# Patient Record
Sex: Female | Born: 1955 | Race: Black or African American | Hispanic: No | Smoking: Never smoker
Health system: Southern US, Community
[De-identification: ages and names within clinical notes are randomized; demographics above are authoritative.]

## PROBLEM LIST (undated history)

## (undated) DIAGNOSIS — M792 Neuralgia and neuritis, unspecified: Secondary | ICD-10-CM

## (undated) DIAGNOSIS — I10 Essential (primary) hypertension: Secondary | ICD-10-CM

## (undated) DIAGNOSIS — F32A Depression, unspecified: Secondary | ICD-10-CM

## (undated) DIAGNOSIS — E119 Type 2 diabetes mellitus without complications: Secondary | ICD-10-CM

## (undated) DIAGNOSIS — K219 Gastro-esophageal reflux disease without esophagitis: Secondary | ICD-10-CM

## (undated) DIAGNOSIS — F329 Major depressive disorder, single episode, unspecified: Secondary | ICD-10-CM

## (undated) DIAGNOSIS — K759 Inflammatory liver disease, unspecified: Secondary | ICD-10-CM

## (undated) DIAGNOSIS — M1990 Unspecified osteoarthritis, unspecified site: Secondary | ICD-10-CM

---

## 2012-07-27 DIAGNOSIS — K219 Gastro-esophageal reflux disease without esophagitis: Secondary | ICD-10-CM

## 2012-07-27 HISTORY — DX: Gastro-esophageal reflux disease without esophagitis: K21.9

## 2014-04-24 ENCOUNTER — Ambulatory Visit: Payer: Self-pay | Admitting: Bariatrics

## 2014-06-05 ENCOUNTER — Ambulatory Visit: Payer: Self-pay | Admitting: Gastroenterology

## 2014-07-27 DIAGNOSIS — M792 Neuralgia and neuritis, unspecified: Secondary | ICD-10-CM

## 2014-07-27 HISTORY — DX: Neuralgia and neuritis, unspecified: M79.2

## 2014-09-21 ENCOUNTER — Ambulatory Visit: Payer: Self-pay | Admitting: Family Medicine

## 2014-11-19 LAB — SURGICAL PATHOLOGY

## 2015-04-02 ENCOUNTER — Encounter
Admission: RE | Admit: 2015-04-02 | Discharge: 2015-04-02 | Disposition: A | Discharge status: Home / Self Care | Payer: 59 | Source: Ambulatory Visit | Attending: Bariatrics | Admitting: Bariatrics

## 2015-04-02 DIAGNOSIS — Z01812 Encounter for preprocedural laboratory examination: Secondary | ICD-10-CM | POA: Diagnosis not present

## 2015-04-02 HISTORY — DX: Gastro-esophageal reflux disease without esophagitis: K21.9

## 2015-04-02 HISTORY — DX: Unspecified osteoarthritis, unspecified site: M19.90

## 2015-04-02 HISTORY — DX: Essential (primary) hypertension: I10

## 2015-04-02 HISTORY — DX: Inflammatory liver disease, unspecified: K75.9

## 2015-04-02 HISTORY — DX: Type 2 diabetes mellitus without complications: E11.9

## 2015-04-02 HISTORY — DX: Neuralgia and neuritis, unspecified: M79.2

## 2015-04-02 LAB — TYPE AND SCREEN
ABO/RH(D): O POS
Antibody Screen: NEGATIVE

## 2015-04-02 LAB — CBC
HEMATOCRIT: 35 % (ref 35.0–47.0)
HEMOGLOBIN: 11.3 g/dL — AB (ref 12.0–16.0)
MCH: 23.9 pg — ABNORMAL LOW (ref 26.0–34.0)
MCHC: 32.4 g/dL (ref 32.0–36.0)
MCV: 73.9 fL — AB (ref 80.0–100.0)
Platelets: 393 10*3/uL (ref 150–440)
RBC: 4.74 MIL/uL (ref 3.80–5.20)
RDW: 15 % — AB (ref 11.5–14.5)
WBC: 7.9 10*3/uL (ref 3.6–11.0)

## 2015-04-02 LAB — BASIC METABOLIC PANEL
ANION GAP: 10 (ref 5–15)
BUN: 39 mg/dL — ABNORMAL HIGH (ref 6–20)
CHLORIDE: 99 mmol/L — AB (ref 101–111)
CO2: 23 mmol/L (ref 22–32)
Calcium: 9.6 mg/dL (ref 8.9–10.3)
Creatinine, Ser: 1.23 mg/dL — ABNORMAL HIGH (ref 0.44–1.00)
GFR calc Af Amer: 55 mL/min — ABNORMAL LOW (ref >60)
GFR calc non Af Amer: 47 mL/min — ABNORMAL LOW (ref >60)
GLUCOSE: 228 mg/dL — AB (ref 65–99)
POTASSIUM: 4.7 mmol/L (ref 3.5–5.1)
Sodium: 132 mmol/L — ABNORMAL LOW (ref 135–145)

## 2015-04-02 LAB — ABO/RH: ABO/RH(D): O POS

## 2015-04-02 NOTE — Pre-Procedure Instructions (Signed)
Pt had a sleep study done a few months ago and results were negative.

## 2015-04-02 NOTE — Patient Instructions (Signed)
  Your procedure is scheduled on: Tuesday April 09, 2015. Report to Same Day Surgery. To find out your arrival time please call [phone] between 1PM - 3PM on Monday April 08, 2015.  Remember: Instructions that are not followed completely may result in serious medical risk, up to and including death, or upon the discretion of your surgeon and anesthesiologist your surgery may need to be rescheduled.    __x__ 1. Do not eat food or drink liquids after midnight. No gum chewing or hard candies.     ____ 2. No Alcohol for 24 hours before or after surgery.   ____ 3. Bring all medications with you on the day of surgery if instructed.    __x__ 4. Notify your doctor if there is any change in your medical condition     (cold, fever, infections).     Do not wear jewelry, make-up, hairpins, clips or nail polish.  Do not wear lotions, powders, or perfumes. You may wear deodorant.  Do not shave 48 hours prior to surgery. Men may shave face and neck.  Do not bring valuables to the hospital.    [facility] is not responsible for any belongings or valuables.               Contacts, dentures or bridgework may not be worn into surgery.  Leave your suitcase in the car. After surgery it may be brought to your room.  For patients admitted to the hospital, discharge time is determined by your treatment team.   Patients discharged the day of surgery will not be allowed to drive home.    Please read over the following fact sheets that you were given:   [facility] Preparing for Surgery  _x___ Take these medicines the morning of surgery with A SIP OF WATER:    1. metoprolol tartrate (LOPRESSOR)    ____ Fleet Enema (as directed)   __x__ Use CHG Soap as directed  ____ Use inhalers on the day of surgery  _x___ Stop metformin 2 days prior to surgery (Sunday)    _x___ Take 1/2 of usual insulin dose the night before surgery and none on the morning of surgery.   ____ Stop  Coumadin/Plavix/aspirin on does not apply.  __x__ Already stopped  Anti-inflammatories.  Tylenol can be taken for pain.   ____ Stop supplements until after surgery.    ____ Bring C-Pap to the hospital.

## 2015-04-09 ENCOUNTER — Inpatient Hospital Stay: Payer: 59 | Admitting: Anesthesiology

## 2015-04-09 ENCOUNTER — Encounter
Admission: RE | Disposition: A | Discharge status: Home / Self Care | Payer: Self-pay | Source: Ambulatory Visit | Attending: Bariatrics

## 2015-04-09 ENCOUNTER — Inpatient Hospital Stay
Admission: RE | Admit: 2015-04-09 | Discharge: 2015-04-11 | DRG: 620 | Disposition: A | Discharge status: Home / Self Care | Payer: 59 | Source: Ambulatory Visit | Attending: Bariatrics | Admitting: Bariatrics

## 2015-04-09 ENCOUNTER — Encounter: Payer: Self-pay | Admitting: *Deleted

## 2015-04-09 DIAGNOSIS — K759 Inflammatory liver disease, unspecified: Secondary | ICD-10-CM | POA: Diagnosis present

## 2015-04-09 DIAGNOSIS — K219 Gastro-esophageal reflux disease without esophagitis: Secondary | ICD-10-CM | POA: Diagnosis present

## 2015-04-09 DIAGNOSIS — M1990 Unspecified osteoarthritis, unspecified site: Secondary | ICD-10-CM | POA: Diagnosis present

## 2015-04-09 DIAGNOSIS — Z6841 Body Mass Index (BMI) 40.0 and over, adult: Secondary | ICD-10-CM | POA: Diagnosis not present

## 2015-04-09 DIAGNOSIS — B9681 Helicobacter pylori [H. pylori] as the cause of diseases classified elsewhere: Secondary | ICD-10-CM | POA: Diagnosis present

## 2015-04-09 DIAGNOSIS — I10 Essential (primary) hypertension: Secondary | ICD-10-CM | POA: Diagnosis present

## 2015-04-09 DIAGNOSIS — M792 Neuralgia and neuritis, unspecified: Secondary | ICD-10-CM | POA: Diagnosis present

## 2015-04-09 DIAGNOSIS — K449 Diaphragmatic hernia without obstruction or gangrene: Secondary | ICD-10-CM | POA: Diagnosis present

## 2015-04-09 DIAGNOSIS — E1142 Type 2 diabetes mellitus with diabetic polyneuropathy: Secondary | ICD-10-CM | POA: Diagnosis present

## 2015-04-09 DIAGNOSIS — K660 Peritoneal adhesions (postprocedural) (postinfection): Secondary | ICD-10-CM | POA: Diagnosis present

## 2015-04-09 DIAGNOSIS — Z833 Family history of diabetes mellitus: Secondary | ICD-10-CM | POA: Diagnosis not present

## 2015-04-09 DIAGNOSIS — K420 Umbilical hernia with obstruction, without gangrene: Secondary | ICD-10-CM | POA: Diagnosis present

## 2015-04-09 DIAGNOSIS — Z794 Long term (current) use of insulin: Secondary | ICD-10-CM | POA: Diagnosis not present

## 2015-04-09 DIAGNOSIS — Z9884 Bariatric surgery status: Secondary | ICD-10-CM

## 2015-04-09 HISTORY — PX: GASTRIC ROUX-EN-Y: SHX5262

## 2015-04-09 HISTORY — PX: UMBILICAL HERNIA REPAIR: SHX196

## 2015-04-09 HISTORY — PX: HIATAL HERNIA REPAIR: SHX195

## 2015-04-09 HISTORY — PX: LYSIS OF ADHESION: SHX5961

## 2015-04-09 LAB — GLUCOSE, CAPILLARY
GLUCOSE-CAPILLARY: 106 mg/dL — AB (ref 65–99)
Glucose-Capillary: 246 mg/dL — ABNORMAL HIGH (ref 65–99)
Glucose-Capillary: 217 mg/dL — ABNORMAL HIGH (ref 65–99)

## 2015-04-09 SURGERY — LAPAROSCOPIC ROUX-EN-Y GASTRIC
Anesthesia: General | Wound class: Clean Contaminated

## 2015-04-09 MED ORDER — CLONIDINE HCL 0.3 MG/24HR TD PTWK
0.3000 mg | MEDICATED_PATCH | TRANSDERMAL | Status: DC
  Filled 2015-04-09: qty 1

## 2015-04-09 MED ORDER — MIDAZOLAM HCL 2 MG/2ML IJ SOLN
INTRAMUSCULAR | Status: DC | PRN
  Administered 2015-04-09: 2 mg via INTRAVENOUS

## 2015-04-09 MED ORDER — HYDROMORPHONE HCL 1 MG/ML IJ SOLN
1.0000 mg | INTRAMUSCULAR | Status: DC | PRN
  Administered 2015-04-09 (×3): 1 mg via INTRAVENOUS
  Administered 2015-04-10 (×2): 2 mg via INTRAVENOUS
  Filled 2015-04-09: qty 2
  Filled 2015-04-09: qty 1
  Filled 2015-04-09: qty 2
  Filled 2015-04-09: qty 1
  Filled 2015-04-09: qty 2

## 2015-04-09 MED ORDER — FENTANYL CITRATE (PF) 100 MCG/2ML IJ SOLN
INTRAMUSCULAR | Status: DC | PRN
  Administered 2015-04-09: 50 ug via INTRAVENOUS
  Administered 2015-04-09: 100 ug via INTRAVENOUS
  Administered 2015-04-09 (×2): 50 ug via INTRAVENOUS

## 2015-04-09 MED ORDER — BUPIVACAINE-EPINEPHRINE (PF) 0.25% -1:200000 IJ SOLN
INTRAMUSCULAR | Status: AC
  Filled 2015-04-09: qty 60

## 2015-04-09 MED ORDER — INSULIN ASPART 100 UNIT/ML ~~LOC~~ SOLN
0.0000 [IU] | Freq: Three times a day (TID) | SUBCUTANEOUS | Status: DC
  Administered 2015-04-10: 3 [IU] via SUBCUTANEOUS
  Administered 2015-04-10: 2 [IU] via SUBCUTANEOUS
  Administered 2015-04-10: 3 [IU] via SUBCUTANEOUS
  Administered 2015-04-11: 2 [IU] via SUBCUTANEOUS
  Filled 2015-04-09: qty 3
  Filled 2015-04-09: qty 2
  Filled 2015-04-09: qty 3
  Filled 2015-04-09: qty 2

## 2015-04-09 MED ORDER — PROPOFOL 10 MG/ML IV BOLUS
INTRAVENOUS | Status: DC | PRN
  Administered 2015-04-09: 200 mg via INTRAVENOUS

## 2015-04-09 MED ORDER — SCOPOLAMINE 1 MG/3DAYS TD PT72
MEDICATED_PATCH | TRANSDERMAL | Status: AC
  Administered 2015-04-09: 1.5 mg via TRANSDERMAL
  Filled 2015-04-09: qty 1

## 2015-04-09 MED ORDER — GABAPENTIN 600 MG PO TABS
600.0000 mg | ORAL_TABLET | Freq: Every day | ORAL | Status: DC
  Administered 2015-04-10: 600 mg via ORAL
  Filled 2015-04-09: qty 1

## 2015-04-09 MED ORDER — SODIUM CHLORIDE 0.9 % IV SOLN
10000.0000 ug | INTRAVENOUS | Status: DC | PRN
  Administered 2015-04-09: 60 ug/min via INTRAVENOUS

## 2015-04-09 MED ORDER — BUPIVACAINE-EPINEPHRINE (PF) 0.25% -1:200000 IJ SOLN
INTRAMUSCULAR | Status: DC | PRN
  Administered 2015-04-09: 60 mL

## 2015-04-09 MED ORDER — HYDROMORPHONE HCL 1 MG/ML IJ SOLN
INTRAMUSCULAR | Status: DC | PRN
  Administered 2015-04-09 (×2): 1 mg via INTRAVENOUS

## 2015-04-09 MED ORDER — FENTANYL CITRATE (PF) 100 MCG/2ML IJ SOLN
INTRAMUSCULAR | Status: AC
  Administered 2015-04-09: 25 ug via INTRAVENOUS
  Filled 2015-04-09: qty 2

## 2015-04-09 MED ORDER — SCOPOLAMINE 1 MG/3DAYS TD PT72
1.0000 | MEDICATED_PATCH | TRANSDERMAL | Status: DC
  Administered 2015-04-09: 1.5 mg via TRANSDERMAL

## 2015-04-09 MED ORDER — POTASSIUM CHLORIDE IN NACL 20-0.45 MEQ/L-% IV SOLN
INTRAVENOUS | Status: DC
  Administered 2015-04-09 – 2015-04-10 (×4): via INTRAVENOUS
  Filled 2015-04-09 (×7): qty 1000

## 2015-04-09 MED ORDER — EPHEDRINE SULFATE 50 MG/ML IJ SOLN
INTRAMUSCULAR | Status: DC | PRN
  Administered 2015-04-09 (×2): 10 mg via INTRAVENOUS

## 2015-04-09 MED ORDER — ONDANSETRON HCL 4 MG/2ML IJ SOLN: 4.0000 mg | INTRAMUSCULAR | Status: DC | PRN

## 2015-04-09 MED ORDER — FENTANYL CITRATE (PF) 100 MCG/2ML IJ SOLN
25.0000 ug | INTRAMUSCULAR | Status: DC | PRN
  Administered 2015-04-09: 25 ug via INTRAVENOUS

## 2015-04-09 MED ORDER — ACETAMINOPHEN 10 MG/ML IV SOLN
INTRAVENOUS | Status: AC
  Filled 2015-04-09: qty 100

## 2015-04-09 MED ORDER — METOCLOPRAMIDE HCL 5 MG/ML IJ SOLN
INTRAMUSCULAR | Status: DC | PRN
  Administered 2015-04-09: 10 mg via INTRAVENOUS

## 2015-04-09 MED ORDER — LACTATED RINGERS IV SOLN
INTRAVENOUS | Status: DC | PRN
  Administered 2015-04-09: 10:00:00 via INTRAVENOUS

## 2015-04-09 MED ORDER — GLYCOPYRROLATE 0.2 MG/ML IJ SOLN
INTRAMUSCULAR | Status: DC | PRN
  Administered 2015-04-09: .4 mg via INTRAVENOUS

## 2015-04-09 MED ORDER — ACETAMINOPHEN 160 MG/5ML PO SOLN: 650.0000 mg | ORAL | Status: DC | PRN

## 2015-04-09 MED ORDER — LABETALOL HCL 5 MG/ML IV SOLN
10.0000 mg | Freq: Four times a day (QID) | INTRAVENOUS | Status: DC | PRN
  Administered 2015-04-10: 10 mg via INTRAVENOUS
  Filled 2015-04-09 (×3): qty 4

## 2015-04-09 MED ORDER — SUCCINYLCHOLINE CHLORIDE 20 MG/ML IJ SOLN
INTRAMUSCULAR | Status: DC | PRN
  Administered 2015-04-09: 120 mg via INTRAVENOUS

## 2015-04-09 MED ORDER — PANTOPRAZOLE SODIUM 40 MG IV SOLR
40.0000 mg | Freq: Every day | INTRAVENOUS | Status: DC
  Administered 2015-04-09 – 2015-04-10 (×2): 40 mg via INTRAVENOUS
  Filled 2015-04-09 (×2): qty 40

## 2015-04-09 MED ORDER — ROCURONIUM BROMIDE 100 MG/10ML IV SOLN
INTRAVENOUS | Status: DC | PRN
  Administered 2015-04-09: 30 mg via INTRAVENOUS
  Administered 2015-04-09: 10 mg via INTRAVENOUS
  Administered 2015-04-09: 20 mg via INTRAVENOUS

## 2015-04-09 MED ORDER — SODIUM CHLORIDE 0.9 % IJ SOLN
INTRAMUSCULAR | Status: AC
  Filled 2015-04-09: qty 10

## 2015-04-09 MED ORDER — PHENYLEPHRINE HCL 10 MG/ML IJ SOLN
INTRAMUSCULAR | Status: DC | PRN
  Administered 2015-04-09 (×3): 200 ug via INTRAVENOUS

## 2015-04-09 MED ORDER — CEFAZOLIN SODIUM-DEXTROSE 2-3 GM-% IV SOLR
INTRAVENOUS | Status: AC
  Administered 2015-04-09: 2 g via INTRAVENOUS
  Filled 2015-04-09: qty 50

## 2015-04-09 MED ORDER — ONDANSETRON HCL 4 MG/2ML IJ SOLN
INTRAMUSCULAR | Status: DC | PRN
Start: 2015-04-09 — End: 2015-04-09
  Administered 2015-04-09: 4 mg via INTRAVENOUS

## 2015-04-09 MED ORDER — PROMETHAZINE HCL 25 MG/ML IJ SOLN
INTRAMUSCULAR | Status: AC
  Administered 2015-04-09: 6.25 mg via INTRAVENOUS
  Filled 2015-04-09: qty 1

## 2015-04-09 MED ORDER — ENOXAPARIN SODIUM 30 MG/0.3ML ~~LOC~~ SOLN
30.0000 mg | Freq: Two times a day (BID) | SUBCUTANEOUS | Status: DC
  Administered 2015-04-10 – 2015-04-11 (×3): 30 mg via SUBCUTANEOUS
  Filled 2015-04-09 (×3): qty 0.3

## 2015-04-09 MED ORDER — UNJURY CHICKEN SOUP POWDER
2.0000 [oz_av] | Freq: Three times a day (TID) | ORAL | Status: DC
  Administered 2015-04-09 – 2015-04-11 (×4): 2 [oz_av] via ORAL

## 2015-04-09 MED ORDER — SODIUM CHLORIDE 0.9 % IV SOLN
INTRAVENOUS | Status: DC
  Administered 2015-04-09: 07:00:00 via INTRAVENOUS

## 2015-04-09 MED ORDER — CEFAZOLIN SODIUM-DEXTROSE 2-3 GM-% IV SOLR
2.0000 g | Freq: Once | INTRAVENOUS | Status: AC
  Administered 2015-04-09: 2 g via INTRAVENOUS

## 2015-04-09 MED ORDER — METOPROLOL TARTRATE 25 MG PO TABS
12.5000 mg | ORAL_TABLET | Freq: Two times a day (BID) | ORAL | Status: DC
  Administered 2015-04-10 – 2015-04-11 (×3): 12.5 mg via ORAL
  Filled 2015-04-09 (×3): qty 1

## 2015-04-09 MED ORDER — ACETAMINOPHEN 10 MG/ML IV SOLN
INTRAVENOUS | Status: DC | PRN
  Administered 2015-04-09: 1000 mg via INTRAVENOUS

## 2015-04-09 MED ORDER — LIDOCAINE HCL (CARDIAC) 20 MG/ML IV SOLN
INTRAVENOUS | Status: DC | PRN
  Administered 2015-04-09: 100 mg via INTRAVENOUS

## 2015-04-09 MED ORDER — FLUTICASONE PROPIONATE 50 MCG/ACT NA SUSP
2.0000 | Freq: Two times a day (BID) | NASAL | Status: DC
  Administered 2015-04-09 – 2015-04-10 (×4): 2 via NASAL
  Filled 2015-04-09: qty 16

## 2015-04-09 MED ORDER — PROMETHAZINE HCL 25 MG/ML IJ SOLN
6.2500 mg | INTRAMUSCULAR | Status: DC | PRN
  Administered 2015-04-09: 6.25 mg via INTRAVENOUS

## 2015-04-09 MED ORDER — NEOSTIGMINE METHYLSULFATE 10 MG/10ML IV SOLN
INTRAVENOUS | Status: DC | PRN
  Administered 2015-04-09: 2.5 mg via INTRAVENOUS

## 2015-04-09 MED ORDER — HYDROCODONE-ACETAMINOPHEN 7.5-325 MG/15ML PO SOLN
5.0000 mL | ORAL | Status: DC | PRN
Start: 2015-04-10 — End: 2015-04-11
  Administered 2015-04-10 – 2015-04-11 (×5): 10 mL via ORAL
  Filled 2015-04-09 (×5): qty 15

## 2015-04-09 SURGICAL SUPPLY — 51 items
APPLIER CLIP 5 13 M/L LIGAMAX5 (MISCELLANEOUS)
APPLIER CLIP ROT 13.4 12 LRG (CLIP)
BANDAGE ELASTIC 6 CLIP NS LF (GAUZE/BANDAGES/DRESSINGS) ×8 IMPLANT
BLADE SURG SZ11 CARB STEEL (BLADE) ×4 IMPLANT
CANISTER SUCT 1200ML W/VALVE (MISCELLANEOUS) ×4 IMPLANT
CATH TRAY 16F METER LATEX (MISCELLANEOUS) ×4 IMPLANT
CHLORAPREP W/TINT 26ML (MISCELLANEOUS) ×8 IMPLANT
CLIP APPLIE 5 13 M/L LIGAMAX5 (MISCELLANEOUS) IMPLANT
CLIP APPLIE ROT 13.4 12 LRG (CLIP) IMPLANT
DEFOGGER SCOPE WARMER CLEARIFY (MISCELLANEOUS) ×4 IMPLANT
DRAPE UTILITY 15X26 TOWEL STRL (DRAPES) ×8 IMPLANT
FILTER LAP SMOKE EVAC STRL (MISCELLANEOUS) ×4 IMPLANT
GLOVE BIO SURGEON STRL SZ7 (GLOVE) ×8 IMPLANT
GLOVE BIOGEL PI IND STRL 8.5 (GLOVE) ×2 IMPLANT
GLOVE BIOGEL PI INDICATOR 8.5 (GLOVE) ×2
GLOVE SURG SYN 8.0 (GLOVE) ×4 IMPLANT
GOWN STRL REUS W/ TWL LRG LVL3 (GOWN DISPOSABLE) ×6 IMPLANT
GOWN STRL REUS W/ TWL XL LVL3 (GOWN DISPOSABLE) ×4 IMPLANT
GOWN STRL REUS W/TWL LRG LVL3 (GOWN DISPOSABLE) ×6
GOWN STRL REUS W/TWL XL LVL3 (GOWN DISPOSABLE) ×4
GRASPER SUT TROCAR 14GX15 (MISCELLANEOUS) ×4 IMPLANT
IRRIGATION STRYKERFLOW (MISCELLANEOUS) ×2 IMPLANT
IRRIGATOR STRYKERFLOW (MISCELLANEOUS) ×4
IV NS 1000ML (IV SOLUTION) ×2
IV NS 1000ML BAXH (IV SOLUTION) ×2 IMPLANT
KIT RM TURNOVER STRD PROC AR (KITS) ×4 IMPLANT
LABEL OR SOLS (LABEL) ×4 IMPLANT
LIQUID BAND (GAUZE/BANDAGES/DRESSINGS) ×4 IMPLANT
NDL SAFETY 22GX1.5 (NEEDLE) ×4 IMPLANT
NS IRRIG 500ML POUR BTL (IV SOLUTION) ×4 IMPLANT
PACK LAP CHOLECYSTECTOMY (MISCELLANEOUS) ×4 IMPLANT
RELOAD BLUE (STAPLE) ×4 IMPLANT
RELOAD STAPLER GOLD 60MM (STAPLE) ×6 IMPLANT
RELOAD STAPLER WHITE 60MM (STAPLE) ×6 IMPLANT
SHEARS HARMONIC ACE PLUS 45CM (MISCELLANEOUS) ×4 IMPLANT
SLEEVE ENDOPATH XCEL 5M (ENDOMECHANICALS) ×12 IMPLANT
SLEEVE GASTRECTOMY 36FR VISIGI (MISCELLANEOUS) ×4 IMPLANT
STAPLER ECHELON LONG 60 440 (INSTRUMENTS) ×4 IMPLANT
STAPLER RELOAD GOLD 60MM (STAPLE) ×12
STAPLER RELOAD WHITE 60MM (STAPLE) ×12
SUT DEVICE BRAIDED 0X39 (SUTURE) ×4 IMPLANT
SUT DEVICE BRAIDED 2.0X39 (SUTURE) ×24 IMPLANT
SUT DVC VICRYL PGA 2.0X39 (SUTURE) ×8 IMPLANT
SUT MNCRL AB 4-0 PS2 18 (SUTURE) ×8 IMPLANT
SUT VIC AB 0 SH 27 (SUTURE) ×4 IMPLANT
SUT VICRYL 0 AB UR-6 (SUTURE) ×12 IMPLANT
SYR 20CC LL (SYRINGE) ×4 IMPLANT
TROCAR XCEL 12X100 BLDLESS (ENDOMECHANICALS) ×4 IMPLANT
TROCAR XCEL NON-BLD 5MMX100MML (ENDOMECHANICALS) ×4 IMPLANT
TUBING INSUFFLATOR HEATED (MISCELLANEOUS) ×4 IMPLANT
WATER STERILE IRR 1000ML POUR (IV SOLUTION) ×4 IMPLANT

## 2015-04-09 NOTE — Interval H&P Note (Signed)
History and Physical Interval Note:  04/09/2015 7:13 AM  Shari Leon  has presented today for surgery, with the diagnosis of S  The various methods of treatment have been discussed with the patient and family. After consideration of risks, benefits and other options for treatment, the patient has consented to  Procedure(s): LAPAROSCOPIC ROUX-EN-Y GASTRIC (N/A) as a surgical intervention .  The patient's history has been reviewed, patient examined, no change in status, stable for surgery.  I have reviewed the patient's chart and labs.  Questions were answered to the patient's satisfaction.     Everette Rank

## 2015-04-09 NOTE — Consult Note (Signed)
[facility] HOSPITALIST  Medical Consultation  FUMI GUADRON OZH:[id number] DOB: [birth year] DOA: 04/09/2015 PCP: Roxana Hires, MD   Requesting physician:  Effie Shy M.D Date of consultation:  04/09/2015 Reason for consultation:  Opinion regarding patient's diabetes hypertension  CHIEF COMPLAINT:  No chief complaint on file.   HISTORY OF PRESENT ILLNESS: Rogene Meth  is a 59 y.o. female with a known history of diabetes, hypertension, GERD and peripheral neuropathy as well as morbid obesity who underwent LAPAROSCOPIC ROUX-EN-Y GASTRIC and LYSIS OF ADHESION earlier today she tolerated the procedure she tolerated procedure. C/o some pressure in abdomen no other complaints.      PAST MEDICAL HISTORY:   Past Medical History  Diagnosis Date  . Diabetes mellitus without complication   . Arthritis   . Hypertension   . GERD (gastroesophageal reflux disease) 2014    history of h. pylori  . Hepatitis   . Peripheral neuropathic pain 2016    both feet    PAST SURGICAL HISTORY:  Past Surgical History  Procedure Laterality Date  . Cesarean section N/A     x 3 1982, 1983 and 1995.  Marland Kitchen Gastric roux-en-y N/A 04/09/2015    Procedure: LAPAROSCOPIC ROUX-EN-Y GASTRIC;  Surgeon: Everette Rank, MD;  Location: [facility] ORS;  Service: General;  Laterality: N/A;  . Lysis of adhesion  04/09/2015    Procedure: LYSIS OF ADHESION;  Surgeon: Everette Rank, MD;  Location: [facility] ORS;  Service: General;;  . Hiatal hernia repair  04/09/2015    Procedure: HERNIA REPAIR HIATAL;  Surgeon: Everette Rank, MD;  Location: [facility] ORS;  Service: General;;  . Umbilical hernia repair  04/09/2015    Procedure: HERNIA REPAIR UMBILICAL ADULT;  Surgeon: Everette Rank, MD;  Location: [facility] ORS;  Service: General;;    SOCIAL HISTORY:  Social History  Substance Use Topics  . Smoking status: Never Smoker   . Smokeless tobacco: Not on file  . Alcohol Use: No    FAMILY HISTORY:  Family History  Problem Relation Age of  Onset  . Diabetes      DRUG ALLERGIES:  Allergies  Allergen Reactions  . Humira [Adalimumab] Other (See Comments)     Blisters and pain on both legs.  . Lisinopril Cough    REVIEW OF SYSTEMS:   CONSTITUTIONAL: No fever, fatigue or weakness.  EYES: No blurred or double vision.  EARS, NOSE, AND THROAT: No tinnitus or ear pain.  RESPIRATORY: No cough, shortness of breath, wheezing or hemoptysis.  CARDIOVASCULAR: No chest pain, orthopnea, edema.  GASTROINTESTINAL: No nausea, vomiting, diarrhea  And positive abdominal pressure  GENITOURINARY: No dysuria, hematuria.  ENDOCRINE: No polyuria, nocturia,  HEMATOLOGY: No anemia, easy bruising or bleeding SKIN: No rash or lesion. MUSCULOSKELETAL: No joint pain or arthritis.   NEUROLOGIC: No tingling, numbness, weakness.  PSYCHIATRY: No anxiety or depression.   MEDICATIONS AT HOME:  Prior to Admission medications   Medication Sig Start Date End Date Taking? Authorizing Provider  acetaminophen (TYLENOL) 500 MG tablet Take 500 mg by mouth 2 (two) times daily as needed.   Yes Historical Provider, MD  Cholecalciferol (VITAMIN D3) 3000 UNITS TABS Take 1 capsule by mouth every morning.   Yes Historical Provider, MD  fluticasone (FLONASE) 50 MCG/ACT nasal spray Place 2 sprays into both nostrils 2 (two) times daily. As needed.   Yes Historical Provider, MD  folic acid (FOLVITE) 1 MG tablet Take 1 mg by mouth every morning.   Yes Historical Provider, MD  gabapentin (  NEURONTIN) 600 MG tablet Take 600 mg by mouth at bedtime.   Yes Historical Provider, MD  metoCLOPramide (REGLAN) 5 MG tablet Take 5 mg by mouth daily as needed for nausea.   Yes Historical Provider, MD  cloNIDine (CATAPRES - DOSED IN MG/24 HR) 0.3 mg/24hr patch Place 0.3 mg onto the skin once a week. Applied on Sundays    Historical Provider, MD  insulin glargine (LANTUS) 100 UNIT/ML injection Inject 60 Units into the skin at bedtime.    Historical Provider, MD  metoprolol tartrate  (LOPRESSOR) 12.5 mg TABS tablet Take 12.5 mg by mouth 2 (two) times daily.    Historical Provider, MD      PHYSICAL EXAMINATION:   VITAL SIGNS: Blood pressure 163/64, pulse 90, temperature 97.7 F (36.5 C), temperature source Oral, resp. rate 16, height 5\' 3" (1.6 m), weight 129.593 kg (285 lb 11.2 oz), SpO2 100 %.  GENERAL:  59 y.o.-year-old patient lying in the bed with no acute distress.  EYES: Pupils equal, round, reactive to light and accommodation. No scleral icterus. Extraocular muscles intact.  HEENT: Head atraumatic, normocephalic. Oropharynx and nasopharynx clear.  NECK:  Supple, no jugular venous distention. No thyroid enlargement, no tenderness.  LUNGS: Normal breath sounds bilaterally, no wheezing, rales,rhonchi or crepitation. No use of accessory muscles of respiration.  CARDIOVASCULAR: S1, S2 normal. No murmurs, rubs, or gallops.  ABDOMEN: Soft, nontender, nondistended. Bowel sounds present. No organomegaly or mass.  EXTREMITIES: No pedal edema, cyanosis, or clubbing.  NEUROLOGIC: Cranial nerves II through XII are intact. Muscle strength 5/5 in all extremities. Sensation intact. Gait not checked.  PSYCHIATRIC: The patient is alert and oriented x 3.  SKIN: No obvious rash, lesion, or ulcer.   LABORATORY PANEL:   CBC No results for input(s): WBC, HGB, HCT, PLT, MCV, MCH, MCHC, RDW, LYMPHSABS, MONOABS, EOSABS, BASOSABS, BANDABS in the last 168 hours.  Invalid input(s): NEUTRABS, BANDSABD ------------------------------------------------------------------------------------------------------------------  Chemistries  No results for input(s): NA, K, CL, CO2, GLUCOSE, BUN, CREATININE, CALCIUM, MG, AST, ALT, ALKPHOS, BILITOT in the last 168 hours.  Invalid input(s): GFRCGP ------------------------------------------------------------------------------------------------------------------ estimated creatinine clearance is 64.8 mL/min (by C-G formula based on Cr of  1.23). ------------------------------------------------------------------------------------------------------------------ No results for input(s): TSH, T4TOTAL, T3FREE, THYROIDAB in the last 72 hours.  Invalid input(s): FREET3   Coagulation profile No results for input(s): INR, PROTIME in the last 168 hours. ------------------------------------------------------------------------------------------------------------------- No results for input(s): DDIMER in the last 72 hours. -------------------------------------------------------------------------------------------------------------------  Cardiac Enzymes No results for input(s): CKMB, TROPONINI, MYOGLOBIN in the last 168 hours.  Invalid input(s): CK ------------------------------------------------------------------------------------------------------------------ Invalid input(s): POCBNP  ---------------------------------------------------------------------------------------------------------------  Urinalysis No results found for: COLORURINE, APPEARANCEUR, LABSPEC, PHURINE, GLUCOSEU, HGBUR, BILIRUBINUR, KETONESUR, PROTEINUR, UROBILINOGEN, NITRITE, LEUKOCYTESUR   RADIOLOGY: No results found.  EKG: No orders found for this or any previous visit.  IMPRESSION AND PLAN: 1.  diabetes type 2 : In light of her surgery and unpredictable by mouth intake will place on sliding scale insulin hold her Lantus unless her pre meal bg level is high  2. htn hold po meds continue catapress, prn labetolol and oral metoprolol  3. gerd- ppi  4. Misc: lovenox for dvt proph  All the records are reviewed and case discussed with ED provider. Management plans discussed with the patient, family and they are in agreement.  CODE STATUS:    Code Status Orders        Start     Ordered   04/09/15 1216  Full code   Continuous     09 /13/16  1215       TOTAL TIME TAKING CARE OF THIS PATIENT: 55 minutes.    Auburn Bilberry M.D on 04/09/2015 at  5:15 PM  Between 7am to 6pm - Pager - [phone]  After 6pm go to [email or web address] - password [email or web address] [facility]  [facility]  Office  [phone]  CC: Primary care physician; Roxana Hires, MD

## 2015-04-09 NOTE — H&P (Signed)
History and physical on paper chart. Permit for gastric bypass and discussed with last visit. Preferred to eliminate reflux as likely post operative outcome. This continues to be problem for patient despite no reflux per UGI.

## 2015-04-09 NOTE — Op Note (Signed)
PATIENT: Shari Leon 1955/09/26  PROCEDURE PERFORMED: Procedure(s): LAPAROSCOPIC ROUX-EN-Y GASTRIC (N/A) LYSIS OF ADHESION HERNIA REPAIR HIATAL HERNIA REPAIR UMBILICAL ADULT PRE-OP DIAGNOSIS: Morbid obesity with long-standing gastroesophageal reflux disease and presence of hiatal hernia POST-OP DIAGNOSIS: Morbid obsiety, hiatal hernia, umbilical hernia with incarcerated omentum, diffuse omental adhesions and interloop adhesions of the small bowel ESTIMATED BLOOD LOSS: less than 50 mL SURGEON: Everette Rank  ASSISTANT: Anabel Halon, PA   PROCEDURE IN DETAIL:The patient was brought to the operating room,  placed in a supine position, general anesthesia obtained with orotracheal intubation. Foley catheter inserted sterilely. TED hose and Thrombo-Gards applied. A foot board applied at the end of the operative bed. The chest and abdomen were sterilely prepped and draped. A 5 mm Optiview trocar  introduced in the left upper quadrant of the abdomen under direct visualization. Pneumoperitoneum obtained with carbon dioxide. Four additional trocars were introduced across the upper abdomen. The omentum was noted be adherent to the lower abdomen with a portion of it herniating through a defect at the umbilicus. To gain access to the small bowel is felt that the multiple omentum needed to be completely mobilized. Antral adhesions to the anterior down wall were taken down by use of the Harmonic scalpel. Following this the omentum was reduced from the hernia sac. Patient also noted to have a broad area of omental adhesions in the left upper quadrant of the abdomen these were all taken down by use of the Harmonic scalpel. The omentum was here into a large number of epiploic appendages of the mid and distal transverse colon these as well were divided by use of the Harmonic scalpel. The omentum was bisected in the region of the mid transverse colon. The ligament of Treitz was identified and the bowel followed  distally 50 cm at which point it was secured to the inferior margin of the stomach. A Nathanson liver retractor introduced through a subxiphoid defect and used to elevate the left lobe of the liver revealing a moderate indentation of the hiatus with thinning of the peritoneum consistent with a sliding hiatal hernia as noted preoperative upper GI series. Given her long-standing history of reflux disease, it was decided to proceed with repair of this. The gastrohepatic ligament was incised followed by division of the peritoneum across the anterior hiatus. Blunt dissection was then used to mobilize the herniated peritoneum and both upper stomach and lower esophagus away from the overlying pericardium. The peritoneum was incised just lateral to the right crus, and blunt dissection was then used to reduce herniated lesser sac fatty tissue. The patient had division of phrenoesophageal ligament associated with the left crus and also attachments of the upper stomach to the undersurface of the left hemidiaphragm. Further circumferential dissection of the esophagus was then performed within the lower mediastinum, sweeping the esophagus and anterior and posterior vagal nerves away from the pericardium and pleural surfaces and also mobilizing away from the aorta. The circumferential dissection was extended into the lower mediastinum over a distance of approximately 7 cm, ultimately resulted in delivery of 2 cm of esophagus lying comfortably in the abdominal cavity. Posterior crural repair was then performed with 2 interrupted 0 Ethibond sutures. The patient then had division of the vascular pedicles immediately adjacent to the lesser curvature of the stomach beginning approximately 6 cm inferior to the GE junction. This was extended superiorly over a distance of approximately 1 cm revealing the lesser sac. A series of gold load GIA staplers were then used to  create a proximal gastric pouch, first firing placed in a transverse  direction followed by a vertical line of staples brought out just lateral to the angle of His. This was done with a 45 [place] ViSiGi device deployed in the upper stomach to be used as an aid in sizing of the gastric pouch. Next, the mobilized jejunum was brought up and secured to the posterior aspect of the gastric pouch.  An enterotomy was then made in the distal posterior aspect of the gastric pouch and opposing portion of the mobilized jejunum. A blue load stapler was used to create a common lumen fired at the 2.5 cm mark. The resulting enterotomy closed with a running 2-0 Polysorb suture. The suture and staple line then reinforced with an additional running Polysorb suture. The latter  performed with a 43 [place] ViSiGi directed through the area of the anastomosis and used as an aid in sizing of the anastomotic lumen. The bowel was divided immediately proximal to this, creating in effect a biliary limb.  There was a partial division of the mesentery. A Roux limb was marked at 125 cm at which point a side-to-side biliary limb to common channel limb anastomosis was created. This was accomplished with enterotomies on the antimesenteric border of these 2 portions of bowel and a white load stapler  used to create a common lumen with a device fired at 3 cm mark. The resulting enterotomy closed with a repeat firing of white load GI stapler. Anti torsion sutures placed distally, the mesenteric window closed with a running 2-0 Surgidac suture. Petersen defect closed in a similar fashion. The bowel was occluded distal to the gastrojejunal anastomosis and insufflation  performed, and a saline bath  performed. No air leak identified in this anastomotic region. The divided limbs of the omentum were secured over the area of the gastrojejunal anastomosis. Petersen defect closed with running 0 Surgidac suture. A small opening or incision was made along the inferior fold of the umbilicus. Blunt dissection was used to create a  preperitoneal plane along the margins of the umbilicus.  The fascia and peritoneum of this defect  then closed with four 0 Vicryl sutures passed by way of a needle suture system under direct visualization. The pneumoperitoneum  relieved, the trocars removed, the wounds  injected with 0.25% Marcaine and closed with 4-0 Monocryl in the dermis followed by Dermabond. Patient  allowed to recover at this point having tolerated the procedure well.

## 2015-04-09 NOTE — Progress Notes (Signed)
Pt is resting in bed. VSS. Denies any needs. Foley removed. Still no urine output as of yet.

## 2015-04-09 NOTE — Transfer of Care (Signed)
Immediate Anesthesia Transfer of Care Note  Patient: Shari Leon  Procedure(s) Performed: Procedure(s): LAPAROSCOPIC ROUX-EN-Y GASTRIC (N/A) LYSIS OF ADHESION HERNIA REPAIR HIATAL HERNIA REPAIR UMBILICAL ADULT  Patient Location: PACU  Anesthesia Type:General  Level of Consciousness: awake, alert , oriented and patient cooperative  Airway & Oxygen Therapy: Patient Spontanous Breathing and Patient connected to nasal cannula oxygen  Post-op Assessment: Report given to RN, Post -op Vital signs reviewed and stable and Patient moving all extremities X 4  Post vital signs: Reviewed and stable  Last Vitals:  Filed Vitals:   04/09/15 1100  BP: 151/69  Pulse: 85  Temp: 37.7 C  Resp: 21    Complications: No apparent anesthesia complications

## 2015-04-09 NOTE — Anesthesia Procedure Notes (Signed)
Procedure Name: Intubation Date/Time: 04/09/2015 7:37 AM Performed by: Peyton Najjar Pre-anesthesia Checklist: Patient identified, Patient being monitored, Timeout performed, Emergency Drugs available and Suction available Patient Re-evaluated:Patient Re-evaluated prior to inductionOxygen Delivery Method: Circle system utilized Preoxygenation: Pre-oxygenation with 100% oxygen Intubation Type: IV induction Ventilation: Mask ventilation without difficulty and Oral airway inserted - appropriate to patient size Laryngoscope Size: 3 and McGraph Grade View: Grade I Tube type: Oral Tube size: 7.0 mm Number of attempts: 1 Airway Equipment and Method: Stylet Placement Confirmation: ETT inserted through vocal cords under direct vision,  positive ETCO2 and breath sounds checked- equal and bilateral Secured at: 22 cm Tube secured with: Tape Dental Injury: Teeth and Oropharynx as per pre-operative assessment

## 2015-04-09 NOTE — Anesthesia Preprocedure Evaluation (Signed)
Anesthesia Evaluation  Patient identified by MRN, date of birth, ID band Patient awake    Reviewed: Allergy & Precautions, H&P , NPO status , Patient's Chart, lab work & pertinent test results, reviewed documented beta blocker date and time   History of Anesthesia Complications Negative for: history of anesthetic complications  Airway Mallampati: II  TM Distance: >3 FB Neck ROM: full    Dental no notable dental hx. (+) Teeth Intact   Pulmonary neg pulmonary ROS,    Pulmonary exam normal breath sounds clear to auscultation       Cardiovascular Exercise Tolerance: Good hypertension, On Medications (-) angina(-) CAD, (-) Past MI, (-) Cardiac Stents and (-) CABG Normal cardiovascular exam(-) dysrhythmias (-) Valvular Problems/Murmurs Rhythm:regular Rate:Normal     Neuro/Psych negative neurological ROS  negative psych ROS   GI/Hepatic GERD  ,(+) Hepatitis -  Endo/Other  negative endocrine ROSdiabetes  Renal/GU negative Renal ROS  negative genitourinary   Musculoskeletal   Abdominal   Peds  Hematology negative hematology ROS (+)   Anesthesia Other Findings Past Medical History:   Diabetes mellitus without complication                       Arthritis                                                    Hypertension                                                 GERD (gastroesophageal reflux disease)          2014           Comment:history of h. pylori   Hepatitis                                                    Peripheral neuropathic pain                     2016           Comment:both feet   Reproductive/Obstetrics negative OB ROS                             Anesthesia Physical Anesthesia Plan  ASA: III  Anesthesia Plan: General   Post-op Pain Management:    Induction:   Airway Management Planned:   Additional Equipment:   Intra-op Plan:   Post-operative Plan:   Informed  Consent: I have reviewed the patients History and Physical, chart, labs and discussed the procedure including the risks, benefits and alternatives for the proposed anesthesia with the patient or authorized representative who has indicated his/her understanding and acceptance.   Dental Advisory Given  Plan Discussed with: Anesthesiologist, CRNA and Surgeon  Anesthesia Plan Comments:         Anesthesia Quick Evaluation

## 2015-04-10 LAB — POTASSIUM: Potassium: 4.2 mmol/L (ref 3.5–5.1)

## 2015-04-10 LAB — CBC WITH DIFFERENTIAL/PLATELET
BASOS PCT: 0 %
Basophils Absolute: 0 10*3/uL (ref 0–0.1)
EOS ABS: 0 10*3/uL (ref 0–0.7)
Eosinophils Relative: 0 %
HEMATOCRIT: 33.8 % — AB (ref 35.0–47.0)
HEMOGLOBIN: 10.6 g/dL — AB (ref 12.0–16.0)
LYMPHS ABS: 0.8 10*3/uL — AB (ref 1.0–3.6)
Lymphocytes Relative: 7 %
MCH: 23.2 pg — AB (ref 26.0–34.0)
MCHC: 31.4 g/dL — AB (ref 32.0–36.0)
MCV: 73.8 fL — ABNORMAL LOW (ref 80.0–100.0)
MONO ABS: 0.8 10*3/uL (ref 0.2–0.9)
MONOS PCT: 6 %
NEUTROS PCT: 87 %
Neutro Abs: 11 10*3/uL — ABNORMAL HIGH (ref 1.4–6.5)
Platelets: 355 10*3/uL (ref 150–440)
RBC: 4.58 MIL/uL (ref 3.80–5.20)
RDW: 15.3 % — AB (ref 11.5–14.5)
WBC: 12.6 10*3/uL — ABNORMAL HIGH (ref 3.6–11.0)

## 2015-04-10 LAB — COMPREHENSIVE METABOLIC PANEL
ALK PHOS: 82 U/L (ref 38–126)
ALT: 125 U/L — ABNORMAL HIGH (ref 14–54)
ANION GAP: 8 (ref 5–15)
AST: 188 U/L — ABNORMAL HIGH (ref 15–41)
Albumin: 3.1 g/dL — ABNORMAL LOW (ref 3.5–5.0)
BILIRUBIN TOTAL: 0.6 mg/dL (ref 0.3–1.2)
BUN: 25 mg/dL — ABNORMAL HIGH (ref 6–20)
CALCIUM: 8.6 mg/dL — AB (ref 8.9–10.3)
CO2: 23 mmol/L (ref 22–32)
Chloride: 101 mmol/L (ref 101–111)
Creatinine, Ser: 1.1 mg/dL — ABNORMAL HIGH (ref 0.44–1.00)
GFR calc non Af Amer: 54 mL/min — ABNORMAL LOW (ref >60)
Glucose, Bld: 245 mg/dL — ABNORMAL HIGH (ref 65–99)
POTASSIUM: 5.3 mmol/L — AB (ref 3.5–5.1)
SODIUM: 132 mmol/L — AB (ref 135–145)
TOTAL PROTEIN: 7.9 g/dL (ref 6.5–8.1)

## 2015-04-10 LAB — GLUCOSE, CAPILLARY
GLUCOSE-CAPILLARY: 249 mg/dL — AB (ref 65–99)
GLUCOSE-CAPILLARY: 239 mg/dL — AB (ref 65–99)
GLUCOSE-CAPILLARY: 212 mg/dL — AB (ref 65–99)
Glucose-Capillary: 196 mg/dL — ABNORMAL HIGH (ref 65–99)

## 2015-04-10 MED ORDER — INSULIN GLARGINE 100 UNIT/ML ~~LOC~~ SOLN
15.0000 [IU] | Freq: Every day | SUBCUTANEOUS | Status: DC
  Administered 2015-04-10: 15 [IU] via SUBCUTANEOUS
  Filled 2015-04-10 (×2): qty 0.15

## 2015-04-10 MED ORDER — GABAPENTIN 600 MG PO TABS: 600.0000 mg | ORAL_TABLET | Freq: Every day | ORAL | Status: AC

## 2015-04-10 MED ORDER — ENOXAPARIN SODIUM 30 MG/0.3ML ~~LOC~~ SOLN: 30.0000 mg | Freq: Two times a day (BID) | SUBCUTANEOUS | Status: DC

## 2015-04-10 MED ORDER — SODIUM CHLORIDE 0.45 % IV SOLN
INTRAVENOUS | Status: DC
  Administered 2015-04-10 – 2015-04-11 (×3): via INTRAVENOUS

## 2015-04-10 MED ORDER — HYDROCODONE-ACETAMINOPHEN 7.5-325 MG/15ML PO SOLN: 5.0000 mL | ORAL | Status: DC | PRN

## 2015-04-10 NOTE — Progress Notes (Signed)
[facility] - [place] at [facility]   PATIENT NAME: Sareena Odeh    MR#:  [id number]  DATE OF BIRTH:  1955-12-30  SUBJECTIVE:  CHIEF COMPLAINT:  Pt is tolerating clear liquids. Denies flatus  REVIEW OF SYSTEMS:  CONSTITUTIONAL: No fever, fatigue or weakness.  EYES: No blurred or double vision.  EARS, NOSE, AND THROAT: No tinnitus or ear pain.  RESPIRATORY: No cough, shortness of breath, wheezing or hemoptysis.  CARDIOVASCULAR: No chest pain, orthopnea, edema.  GASTROINTESTINAL: No nausea, vomiting, diarrhea . Reports  abdominal discomfort  GENITOURINARY: No dysuria, hematuria.  ENDOCRINE: No polyuria, nocturia,  HEMATOLOGY: No anemia, easy bruising or bleeding SKIN: No rash or lesion. MUSCULOSKELETAL: No joint pain or arthritis.   NEUROLOGIC: No tingling, numbness, weakness.  PSYCHIATRY: No anxiety or depression.   DRUG ALLERGIES:   Allergies  Allergen Reactions  . Humira [Adalimumab] Other (See Comments)     Blisters and pain on both legs.  . Lisinopril Cough    VITALS:  Blood pressure 129/63, pulse 99, temperature 98.7 F (37.1 C), temperature source Oral, resp. rate 16, height 5\' 3"  (1.6 m), weight 129.593 kg (285 lb 11.2 oz), SpO2 90 %.  PHYSICAL EXAMINATION:  GENERAL:  59 y.o.-year-old patient lying in the bed with no acute distress.  EYES: Pupils equal, round, reactive to light and accommodation. No scleral icterus. Extraocular muscles intact.  HEENT: Head atraumatic, normocephalic. Oropharynx and nasopharynx clear.  NECK:  Supple, no jugular venous distention. No thyroid enlargement, no tenderness.  LUNGS: Normal breath sounds bilaterally, no wheezing, rales,rhonchi or crepitation. No use of accessory muscles of respiration.  CARDIOVASCULAR: S1, S2 normal. No murmurs, rubs, or gallops.  ABDOMEN: Soft, distended. No Bowel sounds present. No organomegaly or mass.  EXTREMITIES: No pedal edema, cyanosis, or clubbing.  NEUROLOGIC: Cranial  nerves II through XII are intact. Muscle strength 5/5 in all extremities. Sensation intact. Gait not checked.  PSYCHIATRIC: The patient is alert and oriented x 3.  SKIN: No obvious rash, lesion, or ulcer.    LABORATORY PANEL:   CBC  Recent Labs Lab 04/10/15 0409  WBC 12.6*  HGB 10.6*  HCT 33.8*  PLT 355   ------------------------------------------------------------------------------------------------------------------  Chemistries   Recent Labs Lab 04/10/15 0409 04/10/15 1849  NA 132*  --   K 5.3* 4.2  CL 101  --   CO2 23  --   GLUCOSE 245*  --   BUN 25*  --   CREATININE 1.10*  --   CALCIUM 8.6*  --   AST 188*  --   ALT 125*  --   ALKPHOS 82  --   BILITOT 0.6  --    ------------------------------------------------------------------------------------------------------------------  Cardiac Enzymes No results for input(s): TROPONINI in the last 168 hours. ------------------------------------------------------------------------------------------------------------------  RADIOLOGY:  No results found.  EKG:  No orders found for this or any previous visit.  ASSESSMENT AND PLAN:   Assessment/Plan:  IMPRESSION AND PLAN: 1. diabetes type 2 :pod # 1  tolerating clears  on sliding scale insulin  Will give 15 u of  Lantus for basal coverage   2. htn  BP stable continue catapress, prn labetolol and oral metoprolol  3. gerd- ppi  4. Misc: lovenox for dvt proph   Management plans discussed with the patient and she is  in agreement.       All the records are reviewed and case discussed with Care Management/Social Workerr.   CODE STATUS: full  TOTAL TIME TAKING CARE OF THIS PATIENT: 35  minutes.     Ramonita Lab M.D on 04/10/2015 at 10:53 PM  Between 7am to 6pm - Pager - [phone] After 6pm go to [email or web address] - password [email or web address] [facility]  [place] [facility]  Office  [phone]  CC: Primary care physician; Roxana Hires, MD

## 2015-04-10 NOTE — Plan of Care (Signed)
Problem: Food- and Nutrition-Related Knowledge Deficit (NB-1.1) Goal: Nutrition education Formal process to instruct or train a patient/client in a skill or to impart knowledge to help patients/clients voluntarily manage or modify food choices and eating behavior to maintain or improve health. Outcome: Completed/Met Date Met:  04/10/15 INTERVENTION:  RD consulted for nutrition education regarding inpatient bariatric surgery.   RD provided "The Liquid Diet" handout from the Bariatric Surgery Guide from the [facility]. This handout previously provided to patient prior to surgery is a duplicate copy. Discussed what foods/liquids are consistent with a Clear Liquid Diet and reinforced Key Concepts such as no carbonation, no caffeine, or sugar containing beverages. Provided methods to prevent dehydration and promote protein intake, using clock and sample fluid schedule. RD encouraged follow-up with outpatient dietitian after discharge.  Teach back method used.  Expect good compliance.  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    Past Medical History  Diagnosis Date  . Diabetes mellitus without complication    . Arthritis    . Hypertension    . GERD (gastroesophageal reflux disease) 2014      history of h. pylori  . Hepatitis    . Peripheral neuropathic pain 2016      both feet     Body mass index is 50.62 kg/(m^2).   Current diet order is CL with unjury supplement TID, patient is consuming approximately 1-2oz q55mns at this time.   Labs and medications reviewed.   LOW Care Level  ADwyane Luo [place] L[place]ager ([phone]

## 2015-04-10 NOTE — Progress Notes (Signed)
Patient with blood pressure of 191/91. Was given PRN dose of Labetalol IV. Blood pressure rechecked about an hour later and was 132/69

## 2015-04-10 NOTE — H&P (Signed)
Subjective: Interval History: has no complaint of emesis, tolerating sips of liquids. Ambulating with modest SOB post effort. Pain controlled  Objective: Vital signs in last 24 hours: Temp:  [98.4 F (36.9 C)-99.9 F (37.7 C)] 98.4 F (36.9 C) (09/14 1253) Pulse Rate:  [88-109] 90 (09/14 1253) Resp:  [20] 20 (09/14 1253) BP: (132-191)/(51-91) 160/86 mmHg (09/14 1253) SpO2:  [91 %-99 %] 99 % (09/14 1253)  Intake/Output from previous day: 09/13 0701 - 09/14 0700 In: 3937.2 [P.O.:255; I.V.:3682.2] Out: 1765 [Urine:1740] Intake/Output this shift: Total I/O In: 478.9 [I.V.:478.9] Out: 700 [Urine:700]  BP 160/86 mmHg  Pulse 90  Temp(Src) 98.4 F (36.9 C) (Oral)  Resp 20  Ht  (1.6 m)  Wt 129.593 kg (285 lb 11.2 oz)  BMI 50.62 kg/m2  SpO2 99%  General Appearance:    Alert, cooperative, no distress, appears stated age  Head:    Normocephalic, without obvious abnormality, atraumatic  Eyes:    PERRL, conjunctiva/corneas clear, EOM's intact, fundi    benign, both eyes  Ears:    Normal TM's and external ear canals, both ears  Nose:   Nares normal, septum midline, mucosa normal, no drainage    or sinus tenderness  Throat:   Lips, mucosa, and tongue normal; teeth and gums normal  Neck:   Supple, symmetrical, trachea midline, no adenopathy;    thyroid:  no enlargement/tenderness/nodules; no carotid   bruit or JVD  Back:     Symmetric, no curvature, ROM normal, no CVA tenderness  Lungs:     Clear to auscultation bilaterally, respirations unlabored  Chest Wall:    No tenderness or deformity   Heart:    Regular rate and rhythm, S1 and S2 normal, no murmur, rub   or gallop     Abdomen:     Soft, non-tender, bowel sounds active all four quadrants,    no masses, no organomegaly, wounds ok        Extremities:   Extremities normal, atraumatic, no cyanosis or edema  Pulses:   2+ and symmetric all extremities  Skin:   Skin color, texture, turgor normal, no rashes or lesions  Lymph  nodes:   Cervical, supraclavicular, and axillary nodes normal  Neurologic:   CNII-XII intact, normal strength, sensation and reflexes    throughout    Results for orders placed or performed during the hospital encounter of 04/09/15 (from the past 24 hour(s))  Glucose, capillary     Status: Abnormal   Collection Time: 04/09/15  9:11 PM  Result Value Ref Range   Glucose-Capillary 217 (H) 65 - 99 mg/dL  CBC WITH DIFFERENTIAL     Status: Abnormal   Collection Time: 04/10/15  4:09 AM  Result Value Ref Range   WBC 12.6 (H) 3.6 - 11.0 K/uL   RBC 4.58 3.80 - 5.20 MIL/uL   Hemoglobin 10.6 (L) 12.0 - 16.0 g/dL   HCT 16.1 (L) 09.6 - 04.5 %   MCV 73.8 (L) 80.0 - 100.0 fL   MCH 23.2 (L) 26.0 - 34.0 pg   MCHC 31.4 (L) 32.0 - 36.0 g/dL   RDW 40.9 (H) 81.1 - 91.4 %   Platelets 355 150 - 440 K/uL   Neutrophils Relative % 87 %   Neutro Abs 11.0 (H) 1.4 - 6.5 K/uL   Lymphocytes Relative 7 %   Lymphs Abs 0.8 (L) 1.0 - 3.6 K/uL   Monocytes Relative 6 %   Monocytes Absolute 0.8 0.2 - 0.9 K/uL   Eosinophils Relative 0 %  Eosinophils Absolute 0.0 0 - 0.7 K/uL   Basophils Relative 0 %   Basophils Absolute 0.0 0 - 0.1 K/uL  Comprehensive metabolic panel     Status: Abnormal   Collection Time: 04/10/15  4:09 AM  Result Value Ref Range   Sodium 132 (L) 135 - 145 mmol/L   Potassium 5.3 (H) 3.5 - 5.1 mmol/L   Chloride 101 101 - 111 mmol/L   CO2 23 22 - 32 mmol/L   Glucose, Bld 245 (H) 65 - 99 mg/dL   BUN 25 (H) 6 - 20 mg/dL   Creatinine, Ser 9.60 (H) 0.44 - 1.00 mg/dL   Calcium 8.6 (L) 8.9 - 10.3 mg/dL   Total Protein 7.9 6.5 - 8.1 g/dL   Albumin 3.1 (L) 3.5 - 5.0 g/dL   AST 454 (H) 15 - 41 U/L   ALT 125 (H) 14 - 54 U/L   Alkaline Phosphatase 82 38 - 126 U/L   Total Bilirubin 0.6 0.3 - 1.2 mg/dL   GFR calc non Af Amer 54 (L) >60 mL/min   GFR calc Af Amer >60 >60 mL/min   Anion gap 8 5 - 15  Glucose, capillary     Status: Abnormal   Collection Time: 04/10/15  7:29 AM  Result Value Ref Range    Glucose-Capillary 249 (H) 65 - 99 mg/dL  Glucose, capillary     Status: Abnormal   Collection Time: 04/10/15 11:33 AM  Result Value Ref Range   Glucose-Capillary 239 (H) 65 - 99 mg/dL  Glucose, capillary     Status: Abnormal   Collection Time: 04/10/15  4:14 PM  Result Value Ref Range   Glucose-Capillary 196 (H) 65 - 99 mg/dL  Potassium     Status: None   Collection Time: 04/10/15  6:49 PM  Result Value Ref Range   Potassium 4.2 3.5 - 5.1 mmol/L    Studies/Results: No results found.  Scheduled Meds: . cloNIDine  0.3 mg Transdermal Weekly  . enoxaparin (LOVENOX) injection  30 mg Subcutaneous Q12H  . fluticasone  2 spray Each Nare BID  . gabapentin  600 mg Oral QHS  . insulin aspart  0-9 Units Subcutaneous TID WC  . insulin glargine  15 Units Subcutaneous QHS  . metoprolol tartrate  12.5 mg Oral BID  . pantoprazole (PROTONIX) IV  40 mg Intravenous QHS  . protein supplement  2 oz Oral TID WC   Continuous Infusions: . sodium chloride 125 mL/hr at 04/10/15 2022   PRN Meds:acetaminophen (TYLENOL) oral liquid 160 mg/5 mL, HYDROcodone-acetaminophen, HYDROmorphone (DILAUDID) injection, labetalol, ondansetron (ZOFRAN) IV  Assessment/Plan:s/p gastric bypass. Doing well with gradual increased activity and po intake. Voiding well. Note glucose remain modestly high. Plan home in am post establishing sliding scale for home use. Has scripts for pain,nausea and lovenox    LOS: 1 day   Everette Rank

## 2015-04-11 LAB — BASIC METABOLIC PANEL
Anion gap: 8 (ref 5–15)
BUN: 28 mg/dL — AB (ref 6–20)
CALCIUM: 8.2 mg/dL — AB (ref 8.9–10.3)
CO2: 22 mmol/L (ref 22–32)
CREATININE: 1.19 mg/dL — AB (ref 0.44–1.00)
Chloride: 103 mmol/L (ref 101–111)
GFR calc Af Amer: 57 mL/min — ABNORMAL LOW (ref >60)
GFR, EST NON AFRICAN AMERICAN: 49 mL/min — AB (ref >60)
Glucose, Bld: 174 mg/dL — ABNORMAL HIGH (ref 65–99)
POTASSIUM: 4.5 mmol/L (ref 3.5–5.1)
SODIUM: 133 mmol/L — AB (ref 135–145)

## 2015-04-11 LAB — CBC WITH DIFFERENTIAL/PLATELET
Basophils Absolute: 0 10*3/uL (ref 0–0.1)
Basophils Relative: 0 %
EOS ABS: 0.2 10*3/uL (ref 0–0.7)
EOS PCT: 2 %
HCT: 28.6 % — ABNORMAL LOW (ref 35.0–47.0)
Hemoglobin: 9.3 g/dL — ABNORMAL LOW (ref 12.0–16.0)
LYMPHS ABS: 1.3 10*3/uL (ref 1.0–3.6)
Lymphocytes Relative: 12 %
MCH: 23.9 pg — AB (ref 26.0–34.0)
MCHC: 32.7 g/dL (ref 32.0–36.0)
MCV: 73 fL — ABNORMAL LOW (ref 80.0–100.0)
Monocytes Absolute: 0.9 10*3/uL (ref 0.2–0.9)
Monocytes Relative: 9 %
Neutro Abs: 8.1 10*3/uL — ABNORMAL HIGH (ref 1.4–6.5)
Neutrophils Relative %: 77 %
PLATELETS: 304 10*3/uL (ref 150–440)
RBC: 3.92 MIL/uL (ref 3.80–5.20)
RDW: 15.1 % — ABNORMAL HIGH (ref 11.5–14.5)
WBC: 10.5 10*3/uL (ref 3.6–11.0)

## 2015-04-11 LAB — GLUCOSE, CAPILLARY: GLUCOSE-CAPILLARY: 164 mg/dL — AB (ref 65–99)

## 2015-04-11 MED ORDER — PNEUMOCOCCAL VAC POLYVALENT 25 MCG/0.5ML IJ INJ
0.5000 mL | INJECTION | Freq: Once | INTRAMUSCULAR | Status: AC
  Administered 2015-04-11: 0.5 mL via INTRAMUSCULAR
  Filled 2015-04-11: qty 0.5

## 2015-04-11 MED ORDER — PNEUMOCOCCAL VAC POLYVALENT 25 MCG/0.5ML IJ INJ: 0.5000 mL | INJECTION | INTRAMUSCULAR | Status: DC

## 2015-04-11 NOTE — Progress Notes (Signed)
[facility] - [place] at [facility]   PATIENT NAME: Shari Leon    MR#:  [id number]  DATE OF BIRTH:  23-Oct-1955  SUBJECTIVE:  CHIEF COMPLAINT:  Pt feels better, nausea improved, still gasous feeling  REVIEW OF SYSTEMS:  CONSTITUTIONAL: No fever, fatigue or weakness.  EYES: No blurred or double vision.  EARS, NOSE, AND THROAT: No tinnitus or ear pain.  RESPIRATORY: No cough, shortness of breath, wheezing or hemoptysis.  CARDIOVASCULAR: No chest pain, orthopnea, edema.  GASTROINTESTINAL: No nausea, vomiting, diarrhea . Reports  abdominal discomfort  GENITOURINARY: No dysuria, hematuria.  ENDOCRINE: No polyuria, nocturia,  HEMATOLOGY: No anemia, easy bruising or bleeding SKIN: No rash or lesion. MUSCULOSKELETAL: No joint pain or arthritis.   NEUROLOGIC: No tingling, numbness, weakness.  PSYCHIATRY: No anxiety or depression.   DRUG ALLERGIES:   Allergies  Allergen Reactions  . Humira [Adalimumab] Other (See Comments)     Blisters and pain on both legs.  . Lisinopril Cough    VITALS:  Blood pressure 138/72, pulse 89, temperature 97.9 F (36.6 C), temperature source Oral, resp. rate 20, height 5\' 3"  (1.6 m), weight 129.593 kg (285 lb 11.2 oz), SpO2 94 %.  PHYSICAL EXAMINATION:  GENERAL:  59 y.o.-year-old patient lying in the bed with no acute distress.  EYES: Pupils equal, round, reactive to light and accommodation. No scleral icterus. Extraocular muscles intact.  HEENT: Head atraumatic, normocephalic. Oropharynx and nasopharynx clear.  NECK:  Supple, no jugular venous distention. No thyroid enlargement, no tenderness.  LUNGS: Normal breath sounds bilaterally, no wheezing, rales,rhonchi or crepitation. No use of accessory muscles of respiration.  CARDIOVASCULAR: S1, S2 normal. No murmurs, rubs, or gallops.  ABDOMEN: Soft, distended. No Bowel sounds present. No organomegaly or mass.  EXTREMITIES: No pedal edema, cyanosis, or clubbing.  NEUROLOGIC:  Cranial nerves II through XII are intact. Muscle strength 5/5 in all extremities. Sensation intact. Gait not checked.  PSYCHIATRIC: The patient is alert and oriented x 3.  SKIN: No obvious rash, lesion, or ulcer.    LABORATORY PANEL:   CBC  Recent Labs Lab 04/11/15 0333  WBC 10.5  HGB 9.3*  HCT 28.6*  PLT 304   ------------------------------------------------------------------------------------------------------------------  Chemistries   Recent Labs Lab 04/10/15 0409  04/11/15 0333  NA 132*  --  133*  K 5.3*  < > 4.5  CL 101  --  103  CO2 23  --  22  GLUCOSE 245*  --  174*  BUN 25*  --  28*  CREATININE 1.10*  --  1.19*  CALCIUM 8.6*  --  8.2*  AST 188*  --   --   ALT 125*  --   --   ALKPHOS 82  --   --   BILITOT 0.6  --   --   < > = values in this interval not displayed. ------------------------------------------------------------------------------------------------------------------  Cardiac Enzymes No results for input(s): TROPONINI in the last 168 hours. ------------------------------------------------------------------------------------------------------------------  RADIOLOGY:  No results found.  EKG:  No orders found for this or any previous visit.  ASSESSMENT AND PLAN:   Assessment/Plan:  IMPRESSION AND PLAN: 1. diabetes type 2 :pod # 2  tolerating  diet  on sliding scale insulin  Instructed patient on take 30units for next 2 days until po adequate   2. htn  BP stable Resume home meds on d/c  3. gerd- ppi  4. Misc: lovenox for dvt proph     All the records are reviewed and case discussed with  Care Management/Social Workerr.   CODE STATUS: full  TOTAL TIME TAKING CARE OF THIS PATIENT: 35 minutes.     Auburn Bilberry M.D on 04/11/2015 at 9:16 AM  Between 7am to 6pm - Pager - [phone] After 6pm go to [email or web address] - password [email or web address] [facility]  [place] [facility]  Office  [phone]  CC: Primary care physician; Roxana Hires, MD

## 2015-04-11 NOTE — Discharge Summary (Signed)
Physician Discharge Summary  Patient ID: Shari Leon MRN: [id number] DOB/AGE: [birth year] 58 y.o.  Admit date: 04/09/2015 Discharge date: 04/11/2015  Admission Diagnoses:morbid obesity with diabetes, hypertension,GERD with hiatal hernia Discharge Diagnoses: same Active Problems:   Bariatric surgery status    Procedure(s): LAPAROSCOPIC ROUX-EN-Y GASTRIC (N/A) LYSIS OF ADHESION HERNIA REPAIR HIATAL HERNIA REPAIR UMBILICAL ADULT  Discharged Condition: stable  Hospital Course: Tolerated operative procedure well. Began ambulation early and gradually tolerated po well. She was capable of self care by this am. Glucose control has been good with sliding scale.  Consults: internal medicine  Significant Diagnostic Studies: none  Treatments: IV hydration  Discharge Exam: Blood pressure 138/72, pulse 89, temperature 97.9 F (36.6 C), temperature source Oral, resp. rate 20, height 5\' 3"  (1.6 m), weight 129.593 kg (285 lb 11.2 oz), SpO2 94 %. Chest wall: no tenderness GI: soft, non-tender; bowel sounds normal; no masses,  no organomegaly Neurologic: Grossly normal  Disposition: Final discharge disposition not confirmed  Discharge Instructions    Ambulate hourly while awake    Complete by:  As directed      Call MD for:  difficulty breathing, headache or visual disturbances    Complete by:  As directed      Call MD for:  persistant dizziness or light-headedness    Complete by:  As directed      Call MD for:  persistant nausea and vomiting    Complete by:  As directed      Call MD for:  redness, tenderness, or signs of infection (pain, swelling, redness, odor or green/yellow discharge around incision site)    Complete by:  As directed      Call MD for:  severe uncontrolled pain    Complete by:  As directed      Call MD for:  temperature >101 F    Complete by:  As directed      Diet bariatric full liquid    Complete by:  As directed      Discharge instructions    Complete by:  As  directed   F/u in office as scheduled     Incentive spirometry    Complete by:  As directed   Perform hourly while awake            Medication List    STOP taking these medications        metoCLOPramide 5 MG tablet  Commonly known as:  REGLAN     Vitamin D3 3000 UNITS Tabs      TAKE these medications        acetaminophen 500 MG tablet  Commonly known as:  TYLENOL  Take 500 mg by mouth 2 (two) times daily as needed.     cloNIDine 0.3 mg/24hr patch  Commonly known as:  CATAPRES - Dosed in mg/24 hr  Place 0.3 mg onto the skin once a week. Applied on Sundays     enoxaparin 30 MG/0.3ML injection  Commonly known as:  LOVENOX  Inject 0.3 mLs (30 mg total) into the skin every 12 (twelve) hours.     fluticasone 50 MCG/ACT nasal spray  Commonly known as:  FLONASE  Place 2 sprays into both nostrils 2 (two) times daily. As needed.     folic acid 1 MG tablet  Commonly known as:  FOLVITE  Take 1 mg by mouth every morning.     gabapentin 600 MG tablet  Commonly known as:  NEURONTIN  Take 1 tablet (600 mg total) by  mouth at bedtime.     HYDROcodone-acetaminophen 7.5-325 mg/15 ml solution  Commonly known as:  HYCET  Take 5-10 mLs by mouth every 4 (four) hours as needed for moderate pain or severe pain.     insulin glargine 100 UNIT/ML injection  Commonly known as:  LANTUS  Inject 60 Units into the skin at bedtime.     metoprolol tartrate 12.5 mg Tabs tablet  Commonly known as:  LOPRESSOR  Take 12.5 mg by mouth 2 (two) times daily.         Signed: Everette Rank 04/11/2015, 11:01 AM

## 2015-04-11 NOTE — Anesthesia Postprocedure Evaluation (Signed)
  Anesthesia Post-op Note  Patient: Shari Leon  Procedure(s) Performed: Procedure(s): LAPAROSCOPIC ROUX-EN-Y GASTRIC (N/A) LYSIS OF ADHESION HERNIA REPAIR HIATAL HERNIA REPAIR UMBILICAL ADULT  Anesthesia type:General  Patient location: PACU  Post pain: Pain level controlled  Post assessment: Post-op Vital signs reviewed, Patient's Cardiovascular Status Stable, Respiratory Function Stable, Patent Airway and No signs of Nausea or vomiting  Post vital signs: Reviewed and stable  Last Vitals:  Filed Vitals:   04/11/15 0546  BP: 138/72  Pulse: 89  Temp: 36.6 C  Resp: 20    Level of consciousness: awake, alert  and patient cooperative  Complications: No apparent anesthesia complications

## 2015-04-11 NOTE — Progress Notes (Signed)
Alert and oriented. Vss. No signs of acute distress. Voiding. Tolerating diet. Discharge instructions given. Patient verbalized understanding. Patient stated that her follow-up appointment has been set -up with Dr. Allayne Butcher office. Surgical incision dry and intact. No signs of bleeding. No other issues observed.

## 2016-05-04 ENCOUNTER — Ambulatory Visit
Admission: RE | Admit: 2016-05-04 | Discharge: 2016-05-04 | Disposition: A | Discharge status: Home / Self Care | Payer: 59 | Source: Ambulatory Visit | Attending: Internal Medicine | Admitting: Internal Medicine

## 2016-05-04 ENCOUNTER — Other Ambulatory Visit: Payer: Self-pay | Admitting: Internal Medicine

## 2016-05-04 DIAGNOSIS — R102 Pelvic and perineal pain: Secondary | ICD-10-CM | POA: Diagnosis not present

## 2016-05-04 DIAGNOSIS — R938 Abnormal findings on diagnostic imaging of other specified body structures: Secondary | ICD-10-CM | POA: Insufficient documentation

## 2016-05-04 DIAGNOSIS — M5136 Other intervertebral disc degeneration, lumbar region: Secondary | ICD-10-CM | POA: Diagnosis not present

## 2017-09-28 ENCOUNTER — Other Ambulatory Visit: Payer: Self-pay

## 2017-09-28 ENCOUNTER — Encounter: Payer: Self-pay | Admitting: Emergency Medicine

## 2017-09-28 ENCOUNTER — Ambulatory Visit
Admission: EM | Admit: 2017-09-28 | Discharge: 2017-09-28 | Disposition: A | Discharge status: Home / Self Care | Payer: POS | Attending: Family Medicine | Admitting: Family Medicine

## 2017-09-28 DIAGNOSIS — H1033 Unspecified acute conjunctivitis, bilateral: Secondary | ICD-10-CM

## 2017-09-28 DIAGNOSIS — H109 Unspecified conjunctivitis: Secondary | ICD-10-CM

## 2017-09-28 DIAGNOSIS — J0100 Acute maxillary sinusitis, unspecified: Secondary | ICD-10-CM | POA: Diagnosis not present

## 2017-09-28 DIAGNOSIS — R059 Cough, unspecified: Secondary | ICD-10-CM

## 2017-09-28 DIAGNOSIS — R05 Cough: Secondary | ICD-10-CM

## 2017-09-28 HISTORY — DX: Depression, unspecified: F32.A

## 2017-09-28 HISTORY — DX: Major depressive disorder, single episode, unspecified: F32.9

## 2017-09-28 MED ORDER — DOXYCYCLINE HYCLATE 100 MG PO CAPS: 100.0000 mg | ORAL_CAPSULE | Freq: Two times a day (BID) | ORAL | 0 refills | Status: DC

## 2017-09-28 MED ORDER — HYDROCOD POLST-CPM POLST ER 10-8 MG/5ML PO SUER: 5.0000 mL | Freq: Every evening | ORAL | 0 refills | Status: DC | PRN

## 2017-09-28 MED ORDER — POLYMYXIN B-TRIMETHOPRIM 10000-0.1 UNIT/ML-% OP SOLN: 1.0000 [drp] | OPHTHALMIC | 0 refills | Status: AC

## 2017-09-28 MED ORDER — BENZONATATE 100 MG PO CAPS: 100.0000 mg | ORAL_CAPSULE | Freq: Three times a day (TID) | ORAL | 0 refills | Status: DC | PRN

## 2017-09-28 NOTE — Discharge Instructions (Signed)
Take medication as prescribed. Rest. Drink plenty of fluids.  ° °Follow up with your primary care physician this week as needed. Return to Urgent care for new or worsening concerns.  ° °

## 2017-09-28 NOTE — ED Provider Notes (Signed)
[facility] ____________________________________________  Time seen: Approximately 8:58 AM  I have reviewed the triage vital signs and the nursing notes.   HISTORY  Chief Complaint Generalized Body Aches   HPI Shari Leon is a 62 y.o. female presenting for evaluation of cough and congestion symptoms have been present for just over 1 week.  Patient reports that initial sickness onset she had a sore throat and loss of voice which has improved.  States cough and congestion quickly followed with some chills and body aches, questionable fevers at that time.  No fever sensation in the last few days.  Reports continues with a lot of sinus pressure, sinus drainage, coughing congestion in chest.  Denies associated chest pain, hemoptysis or shortness of breath.  Also reports of the last few days she has had some greenish drainage from bilateral eyes with goofiness.  Denies vision changes, vision loss, foreign body sensation or trauma to the eyes.  States mild sinus pressure sensation currently around her cheeks.  States cough has been disrupting sleep.  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  Denies other recent sickness.  States symptoms have been unresolved with multiple over-the-counter cough and decongestant agents. Denies chest pain, shortness of breath, abdominal pain, dysuria, or rash. Denies recent sickness. Denies recent antibiotic use.   Roxana HiresSzabo, Andrea Tunde, MD: PCP   Past Medical History:  Diagnosis Date  . Arthritis   . Depression   . Diabetes mellitus without complication (HCC)   . GERD (gastroesophageal reflux disease) 2014   history of h. pylori  . Hepatitis   . Hypertension   . Peripheral neuropathic pain 2016   both feet    Patient Active Problem List   Diagnosis Date Noted  . Bariatric surgery status 04/09/2015    Past Surgical History:  Procedure Laterality Date  . CESAREAN SECTION N/A    x 3 1982, 1983 and 1995.  Marland Kitchen. GASTRIC ROUX-EN-Y N/A  04/09/2015   Procedure: LAPAROSCOPIC ROUX-EN-Y GASTRIC;  Surgeon: Everette RankMichael A Tyner, MD;  Location: [facility];  Service: General;  Laterality: N/A;  . HIATAL HERNIA REPAIR  04/09/2015   Procedure: HERNIA REPAIR HIATAL;  Surgeon: Everette RankMichael A Tyner, MD;  Location: [facility];  Service: General;;  . LYSIS OF ADHESION  04/09/2015   Procedure: LYSIS OF ADHESION;  Surgeon: Everette RankMichael A Tyner, MD;  Location: [facility];  Service: General;;  . UMBILICAL HERNIA REPAIR  04/09/2015   Procedure: HERNIA REPAIR UMBILICAL ADULT;  Surgeon: Everette RankMichael A Tyner, MD;  Location: [facility];  Service: General;;     No current facility-administered medications for this encounter.   Current Outpatient Medications:  .  acetaminophen (TYLENOL) 500 MG tablet, Take 500 mg by mouth 2 (two) times daily as needed., Disp: , Rfl:  .  aspirin EC 81 MG tablet, Take 81 mg by mouth daily., Disp: , Rfl:  .  cloNIDine (CATAPRES - DOSED IN MG/24 HR) 0.3 mg/24hr patch, Place 0.3 mg onto the skin once a week. Applied on Sundays, Disp: , Rfl:  .  diclofenac sodium (VOLTAREN) 1 % GEL, Apply 2 g topically 2 (two) times daily as needed., Disp: , Rfl:  .  DULoxetine (CYMBALTA) 20 MG capsule, Take 20 mg by mouth daily., Disp: , Rfl:  .  fluticasone (FLONASE) 50 MCG/ACT nasal spray, Place 2 sprays into both nostrils 2 (two) times daily. As needed., Disp: , Rfl:  .  gabapentin (NEURONTIN) 600 MG tablet, Take 1 tablet (600 mg total) by mouth at bedtime.,  Disp: , Rfl:  .  hydroxychloroquine (PLAQUENIL) 200 MG tablet, Take 400 mg by mouth daily., Disp: , Rfl:  .  metoprolol tartrate (LOPRESSOR) 12.5 mg TABS tablet, Take 12.5 mg by mouth 2 (two) times daily., Disp: , Rfl:  .  Multiple Vitamin (MULTIVITAMIN) tablet, Take 1 tablet by mouth daily., Disp: , Rfl:  .  valsartan-hydrochlorothiazide (DIOVAN-HCT) 160-12.5 MG tablet, Take 1 tablet by mouth daily., Disp: , Rfl:  .  benzonatate (TESSALON PERLES) 100 MG capsule, Take 1 capsule (100 mg total) by mouth 3 (three)  times daily as needed for cough., Disp: 15 capsule, Rfl: 0 .  chlorpheniramine-HYDROcodone (TUSSIONEX PENNKINETIC ER) 10-8 MG/5ML SUER, Take 5 mLs by mouth at bedtime as needed for cough. do not drive or operate machinery while taking as can cause drowsiness., Disp: 60 mL, Rfl: 0 .  doxycycline (VIBRAMYCIN) 100 MG capsule, Take 1 capsule (100 mg total) by mouth 2 (two) times daily., Disp: 20 capsule, Rfl: 0 .  trimethoprim-polymyxin b (POLYTRIM) ophthalmic solution, Place 1 drop into both eyes every 4 (four) hours for 7 days., Disp: 10 mL, Rfl: 0  Allergies Humira [adalimumab] and Lisinopril  Family History  Problem Relation Age of Onset  . Diabetes Unknown   . Hypertension Mother   . Healthy Father     Social History Social History   Tobacco Use  . Smoking status: Never Smoker  . Smokeless tobacco: Never Used  Substance Use Topics  . Alcohol use: No  . Drug use: No    Review of Systems Constitutional: As above.  Eyes: No visual changes. ENT: AS above.  Cardiovascular: Denies chest pain. Respiratory: Denies shortness of breath. Gastrointestinal: No abdominal pain.   Musculoskeletal: Negative for back pain. Skin: Negative for rash.   ____________________________________________   PHYSICAL EXAM:  VITAL SIGNS: ED Triage Vitals [09/28/17 0820]  Enc Vitals Group     BP (!) 158/54     Pulse Rate 70     Resp 16     Temp 98.4 F (36.9 C)     Temp Source Oral     SpO2 100 %     Weight 215 lb (97.5 kg)     Height 5\' 3"  (1.6 m)     Head Circumference      Peak Flow      Pain Score 10     Pain Loc      Pain Edu?      Excl. in GC?    Constitutional: Alert and oriented. Well appearing and in no acute distress. Eyes: Mild bilateral conjunctival injection and scant greenish drainage bilaterally.  No foreign body appearance.  Bilateral eyes nontender.  No surrounding swelling, erythema or tenderness.  PERRL. EOMI. Head: Atraumatic.Mild to moderate tenderness to palpation  bilateral maxillary sinuses.  No frontal sinus tenderness palpation.  No swelling. No erythema.   Ears: no erythema, normal TMs bilaterally.   Nose: nasal congestion with bilateral nasal turbinate erythema and edema.   Mouth/Throat: Mucous membranes are moist.  Oropharynx non-erythematous.No tonsillar swelling or exudate.  Neck: No stridor.  No cervical spine tenderness to palpation. Hematological/Lymphatic/Immunilogical: No cervical lymphadenopathy. Cardiovascular: Normal rate, regular rhythm. Grossly normal heart sounds.  Good peripheral circulation. Respiratory: Normal respiratory effort.  No retractions. No wheezes, rales or rhonchi. Good air movement.  Musculoskeletal:  No cervical, thoracic or lumbar tenderness to palpation.  Neurologic:  Normal speech and language. No gross focal neurologic deficits are appreciated. No gait instability. Skin:  Skin is warm, dry and  intact. No rash noted. Psychiatric: Mood and affect are normal. Speech and behavior are normal.  ___________________________________________   LABS (all labs ordered are listed, but only abnormal results are displayed)  Labs Reviewed - No data to display  PROCEDURES Procedures    INITIAL IMPRESSION / ASSESSMENT AND PLAN / ED COURSE  Pertinent labs & imaging results that were available during my care of the patient were reviewed by me and considered in my medical decision making (see chart for details).  Well-appearing patient.  Suspect recent viral illness.  Suspect secondary sinusitis and viral conjunctivitis versus bacterial conjunctivitis.  Will treat patient with oral doxycycline, PRN Tessalon Perles, as needed Tussionex and Polytrim.  Encourage rest, fluids, supportive care.  Work note given for today and tomorrow.Discussed indication, risks and benefits of medications with patient.  Discussed follow up with Primary care physician this week. Discussed follow up and return parameters including no resolution or any  worsening concerns. Patient verbalized understanding and agreed to plan.   ____________________________________________   FINAL CLINICAL IMPRESSION(S) / ED DIAGNOSES  Final diagnoses:  Acute maxillary sinusitis, recurrence not specified  Cough  Bacterial conjunctivitis     ED Discharge Orders        Ordered    benzonatate (TESSALON PERLES) 100 MG capsule  3 times daily PRN     09/28/17 0842    doxycycline (VIBRAMYCIN) 100 MG capsule  2 times daily     09/28/17 0842    trimethoprim-polymyxin b (POLYTRIM) ophthalmic solution  Every 4 hours     09/28/17 0842    chlorpheniramine-HYDROcodone (TUSSIONEX PENNKINETIC ER) 10-8 MG/5ML SUER  At bedtime PRN     09/28/17 0845       Note: This dictation was prepared with Dragon dictation along with smaller phrase technology. Any transcriptional errors that result from this process are unintentional.         Renford Dills, NP 09/28/17 [phone]

## 2017-09-28 NOTE — ED Triage Notes (Signed)
Patient in today c/o body aches, chills, chest and nasal congestion x 1 wk.  Patient also states she is now having drainage from eyes with matting.

## 2017-12-06 IMAGING — US US PELVIS COMPLETE
1 series · 14 of 25 positions shown · non-contrast
Comparison: None

CLINICAL DATA: One week history pelvic pain.



[Series 1: us pelvis complete · 0.20mm/px · 14 of 116 slices shown]
[im 1/116]
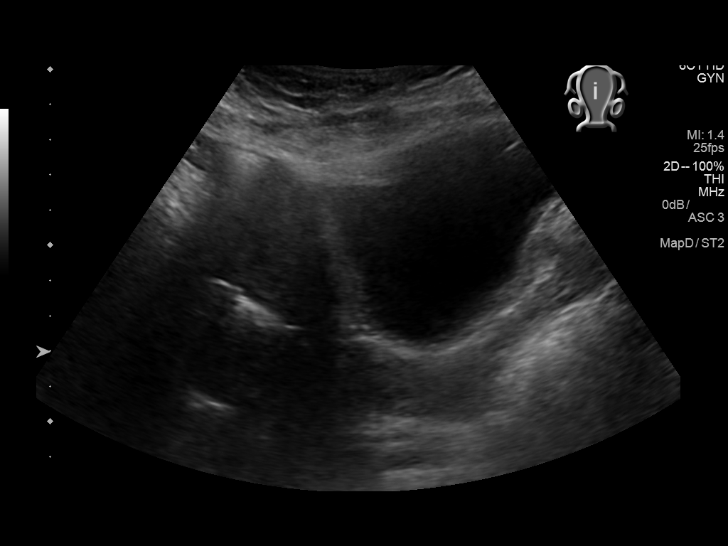
[im 10/116]
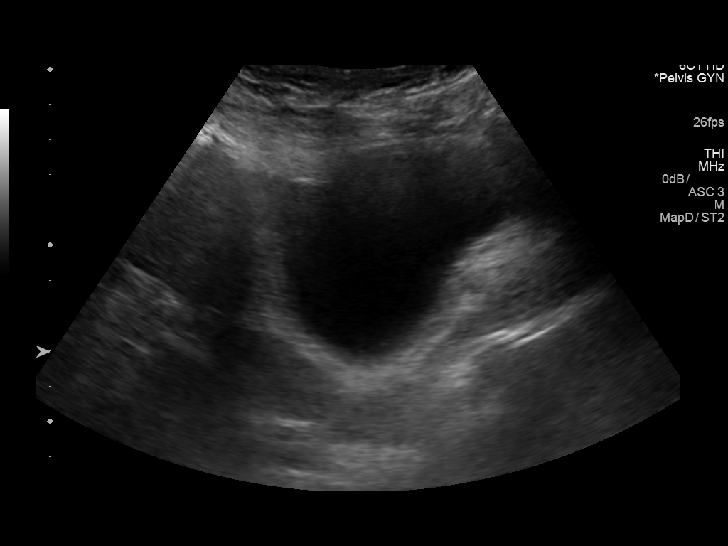
[im 20/116]
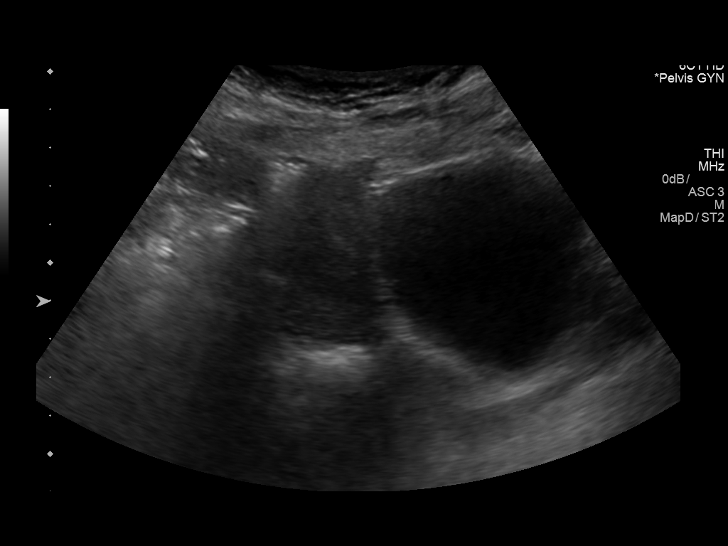
[im 29/116]
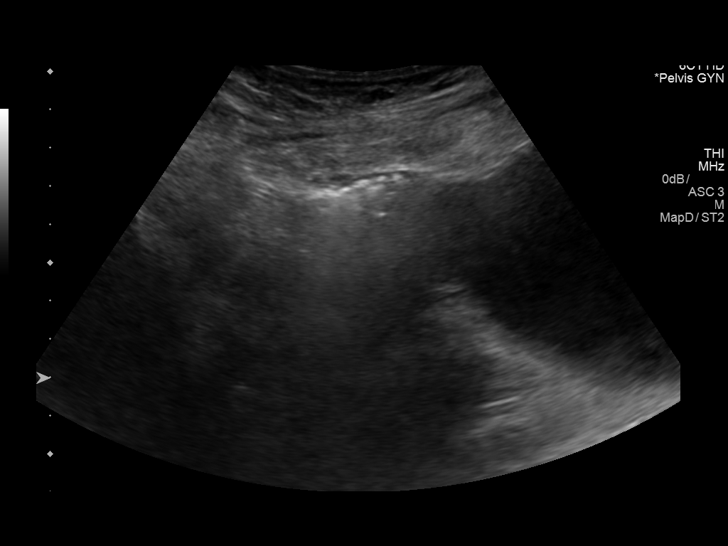
[im 39/116]
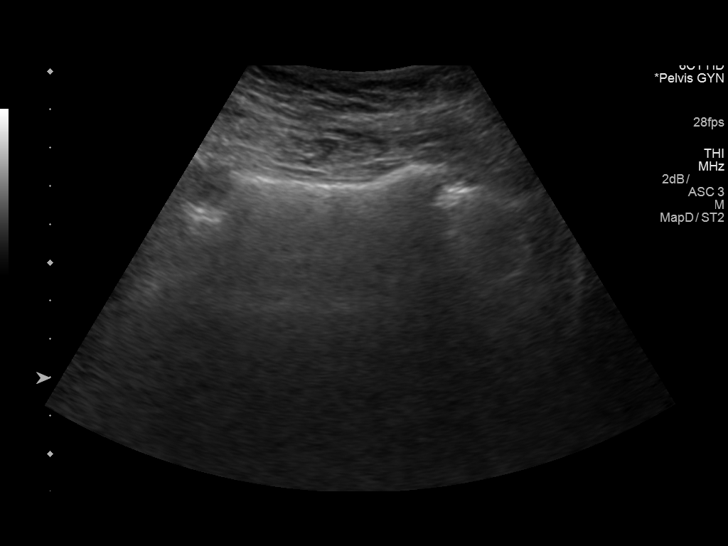
[im 44/116]
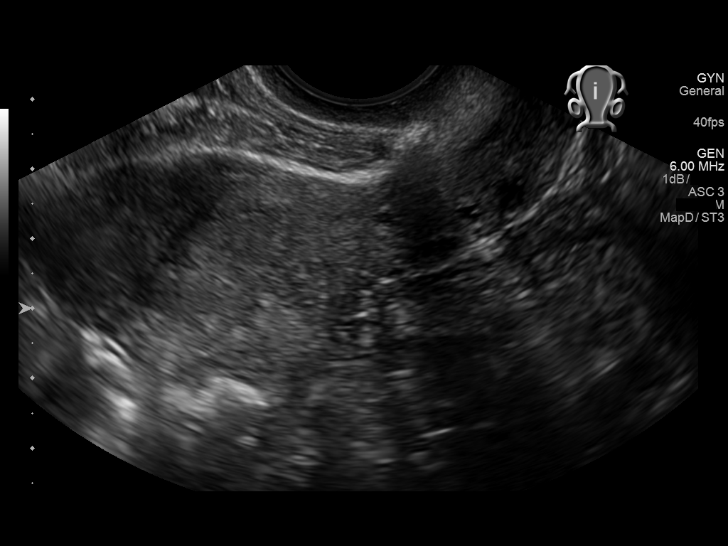
[im 53/116]
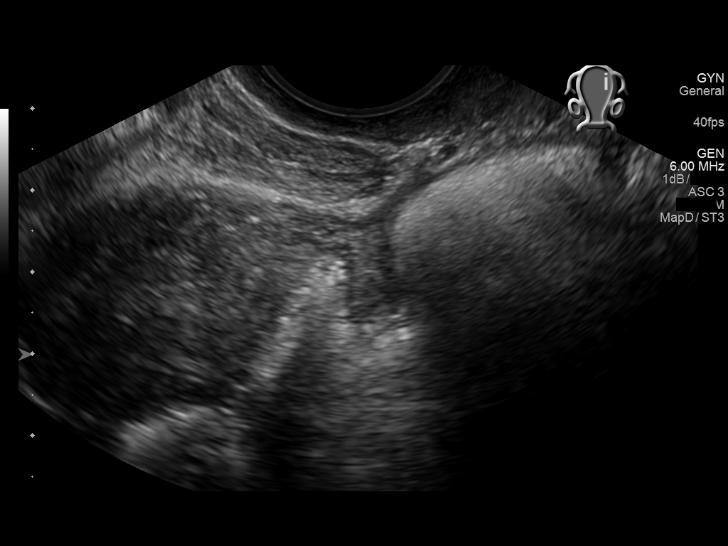
[im 63/116]
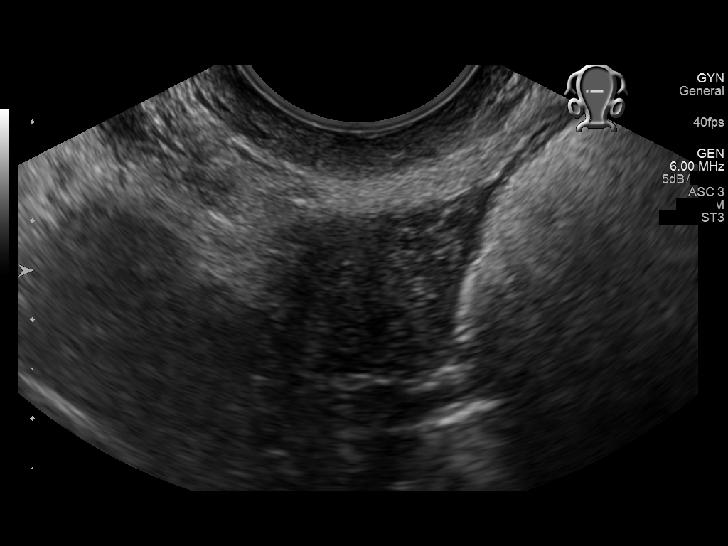
[im 72/116]
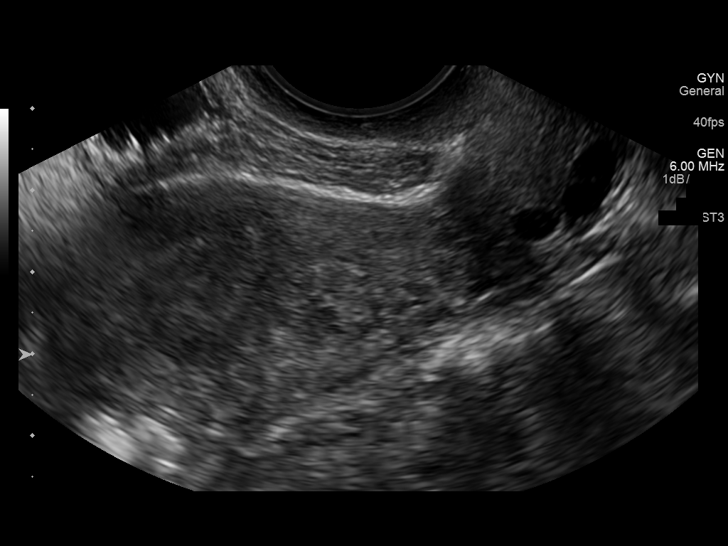
[im 77/116]
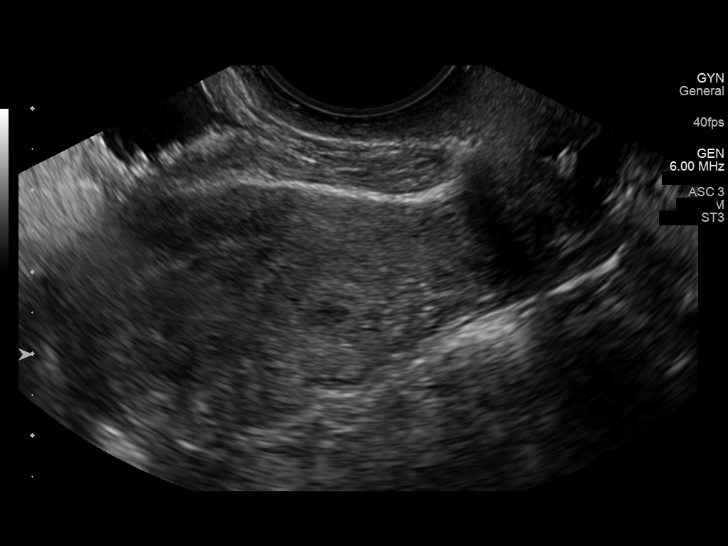
[im 87/116]
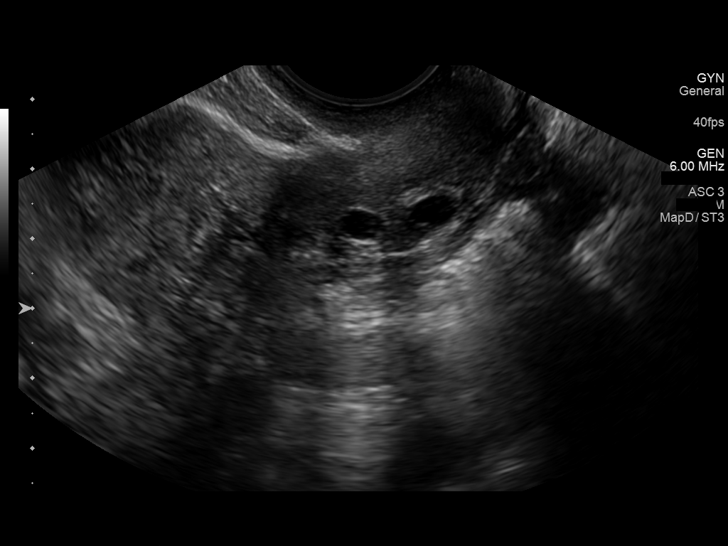
[im 96/116]
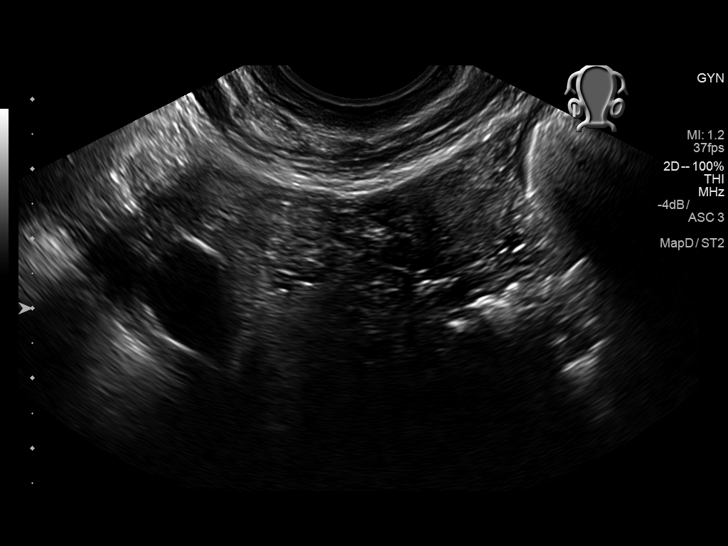
[im 106/116]
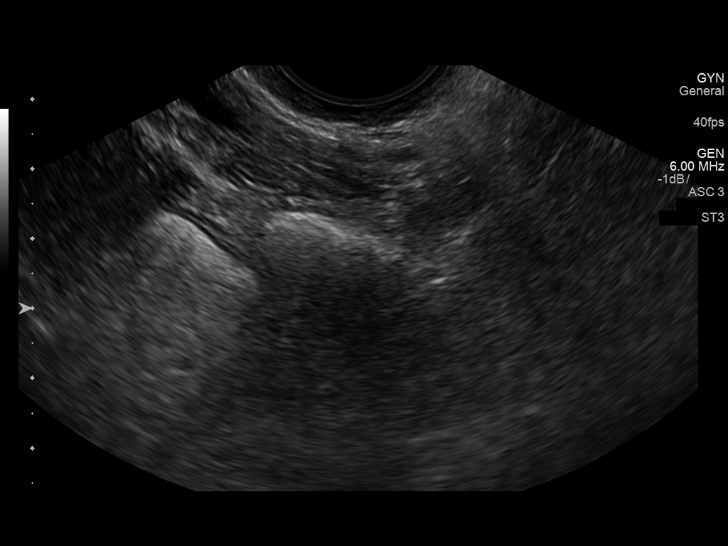
[im 116/116]
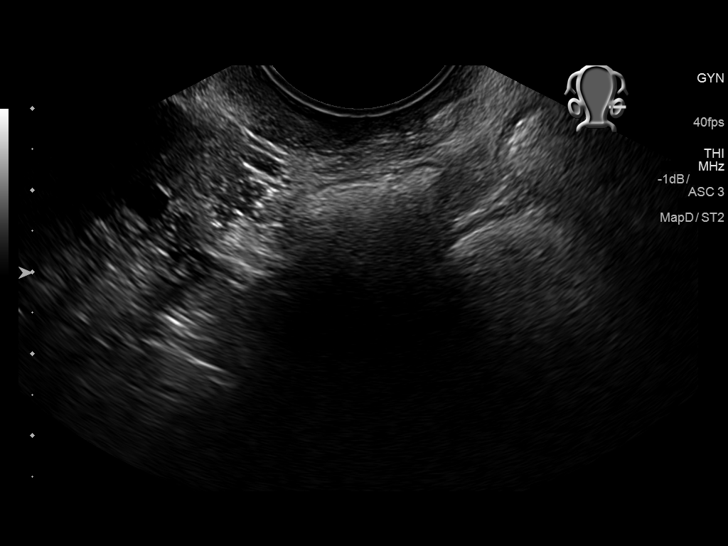

[14 of 25 positions shown; findings below may reference images not displayed]

FINDINGS: Uterus

Measurements: 9.5 x 3.2 x 3.4 cm. No discrete fibroids identified.
Myometrium is diffusely heterogeneous suggesting adenomyosis.

Endometrium

Thickness: 13 mm.  No focal abnormality visualized.

Right ovary

Measurements: Not visualized..

Left ovary

Measurements: Not visualized.

Other findings

No intraperitoneal free fluid.
IMPRESSION: 1. Heterogeneous myometrial echotexture suggests underlying
adenomyosis.
2. 13 mm endometrial stripe thickness. Endometrial thickness is
considered abnormal for an asymptomatic post-menopausal female.
Endometrial sampling should be considered to exclude carcinoma.

## 2021-04-14 ENCOUNTER — Encounter: Payer: Self-pay | Admitting: Emergency Medicine

## 2021-04-14 ENCOUNTER — Other Ambulatory Visit: Payer: Self-pay

## 2021-04-14 ENCOUNTER — Ambulatory Visit
Admission: EM | Admit: 2021-04-14 | Discharge: 2021-04-14 | Disposition: A | Discharge status: Home / Self Care | Payer: POS | Attending: Family Medicine | Admitting: Family Medicine

## 2021-04-14 DIAGNOSIS — U071 COVID-19: Secondary | ICD-10-CM | POA: Insufficient documentation

## 2021-04-14 LAB — RESP PANEL BY RT-PCR (FLU A&B, COVID) ARPGX2
Influenza A by PCR: NEGATIVE
Influenza B by PCR: NEGATIVE
SARS Coronavirus 2 by RT PCR: POSITIVE — AB

## 2021-04-14 MED ORDER — BENZONATATE 100 MG PO CAPS: 200.0000 mg | ORAL_CAPSULE | Freq: Three times a day (TID) | ORAL | 0 refills | Status: AC

## 2021-04-14 MED ORDER — NIRMATRELVIR/RITONAVIR (PAXLOVID) TABLET (RENAL DOSING): 2.0000 | ORAL_TABLET | Freq: Two times a day (BID) | ORAL | 0 refills | Status: AC

## 2021-04-14 MED ORDER — IPRATROPIUM BROMIDE 0.06 % NA SOLN: 2.0000 | Freq: Four times a day (QID) | NASAL | 12 refills | Status: AC

## 2021-04-14 MED ORDER — PROMETHAZINE-DM 6.25-15 MG/5ML PO SYRP: 5.0000 mL | ORAL_SOLUTION | Freq: Four times a day (QID) | ORAL | 0 refills | Status: AC | PRN

## 2021-04-14 NOTE — ED Provider Notes (Signed)
[facility]    CSN: [id number] Arrival date & time: 04/14/21  5956      History   Chief Complaint Chief Complaint  Patient presents with   Cough    HPI Shari Leon is a 65 y.o. female.   HPI  65 year old female here for evaluation of respiratory symptoms.  Patient reports that yesterday she developed a productive cough with tan sputum, runny nose and nasal congestion for a grayish clear nasal discharge, chills, headache, body aches, and diarrhea.  She took a home COVID test that was positive but it is expired so she is requesting to be retested.  She denies fever, sore throat, ear pain, shortness breath or wheezing, nausea, or vomiting.  She states that she did not check her fever at home.  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  Patient is febrile in clinic at 100.7.  Patient's last CMP was obtained 03/20/2021 with a GFR of 50.  Past Medical History:  Diagnosis Date   Arthritis    Depression    Diabetes mellitus without complication (HCC)    GERD (gastroesophageal reflux disease) 2014   history of h. pylori   Hepatitis    Hypertension    Peripheral neuropathic pain 2016   both feet    Patient Active Problem List   Diagnosis Date Noted   Bariatric surgery status 04/09/2015    Past Surgical History:  Procedure Laterality Date   CESAREAN SECTION N/A    x 3 1982, 1983 and 1995.   GASTRIC ROUX-EN-Y N/A 04/09/2015   Procedure: LAPAROSCOPIC ROUX-EN-Y GASTRIC;  Surgeon: Everette Rank, MD;  Location: [facility];  Service: General;  Laterality: N/A;   HIATAL HERNIA REPAIR  04/09/2015   Procedure: HERNIA REPAIR HIATAL;  Surgeon: Everette Rank, MD;  Location: [facility];  Service: General;;   LYSIS OF ADHESION  04/09/2015   Procedure: LYSIS OF ADHESION;  Surgeon: Everette Rank, MD;  Location: [facility];  Service: General;;   UMBILICAL HERNIA REPAIR  04/09/2015   Procedure: HERNIA REPAIR UMBILICAL ADULT;  Surgeon: Everette Rank, MD;   Location: [facility];  Service: General;;    OB History   No obstetric history on file.      Home Medications    Prior to Admission medications   Medication Sig Start Date End Date Taking? Authorizing Provider  acetaminophen (TYLENOL) 500 MG tablet Take 500 mg by mouth 2 (two) times daily as needed.   Yes [provider]  aspirin EC 81 MG tablet Take 81 mg by mouth daily.   Yes [provider]  benzonatate (TESSALON) 100 MG capsule Take 2 capsules (200 mg total) by mouth every 8 (eight) hours. 04/14/21  Yes Becky Augusta, NP  cloNIDine (CATAPRES - DOSED IN MG/24 HR) 0.3 mg/24hr patch Place 0.3 mg onto the skin once a week. Applied on Sundays   Yes [provider]  diclofenac sodium (VOLTAREN) 1 % GEL Apply 2 g topically 2 (two) times daily as needed.   Yes [provider]  DULoxetine (CYMBALTA) 20 MG capsule Take 20 mg by mouth daily.   Yes [provider]  fluticasone (FLONASE) 50 MCG/ACT nasal spray Place 2 sprays into both nostrils 2 (two) times daily. As needed.   Yes [provider]  gabapentin (NEURONTIN) 600 MG tablet Take 1 tablet (600 mg total) by mouth at bedtime. 04/10/15  Yes Everette Rank, MD  hydroxychloroquine (PLAQUENIL) 200 MG tablet Take  400 mg by mouth daily.   Yes [provider]  ipratropium (ATROVENT) 0.06 % nasal spray Place 2 sprays into both nostrils 4 (four) times daily. 04/14/21  Yes Becky Augusta, NP  metoprolol tartrate (LOPRESSOR) 12.5 mg TABS tablet Take 12.5 mg by mouth 2 (two) times daily.   Yes [provider]  Multiple Vitamin (MULTIVITAMIN) tablet Take 1 tablet by mouth daily.   Yes [provider]  nirmatrelvir/ritonavir EUA, renal dosing, (PAXLOVID) 10 x 150 MG & 10 x 100MG  TABS Take 2 tablets by mouth 2 (two) times daily for 5 days. Patient GFR is 50. Take nirmatrelvir (150 mg) one tablet twice daily for 5 days and ritonavir (100 mg) one tablet twice daily for 5 days. 04/14/21  04/19/21 Yes 04/21/21, NP  promethazine-dextromethorphan (PROMETHAZINE-DM) 6.25-15 MG/5ML syrup Take 5 mLs by mouth 4 (four) times daily as needed. 04/14/21  Yes 04/16/21, NP  valsartan-hydrochlorothiazide (DIOVAN-HCT) 160-12.5 MG tablet Take 1 tablet by mouth daily.   Yes [provider]    Family History Family History  Problem Relation Age of Onset   Diabetes Unknown    Hypertension Mother    Healthy Father     Social History Social History   Tobacco Use   Smoking status: Never   Smokeless tobacco: Never  Vaping Use   Vaping Use: Never used  Substance Use Topics   Alcohol use: No   Drug use: No     Allergies   Humira [adalimumab] and Lisinopril   Review of Systems Review of Systems  Constitutional:  Positive for chills. Negative for activity change and appetite change.  HENT:  Positive for congestion and rhinorrhea. Negative for ear pain and sore throat.   Respiratory:  Positive for cough. Negative for shortness of breath and wheezing.   Gastrointestinal:  Positive for diarrhea. Negative for nausea and vomiting.  Musculoskeletal:  Positive for arthralgias and myalgias.  Skin:  Negative for rash.  Neurological:  Positive for headaches.  Hematological: Negative.   Psychiatric/Behavioral: Negative.      Physical Exam Triage Vital Signs ED Triage Vitals  Enc Vitals Group     BP 04/14/21 0846 (!) 168/75     Pulse Rate 04/14/21 0846 72     Resp 04/14/21 0846 18     Temp 04/14/21 0846 (!) 100.7 F (38.2 C)     Temp Source 04/14/21 0846 Oral     SpO2 04/14/21 0846 100 %     Weight 04/14/21 0842 214 lb 15.2 oz (97.5 kg)     Height 04/14/21 0842 5\' 3"  (1.6 m)     Head Circumference --      Peak Flow --      Pain Score 04/14/21 0842 3     Pain Loc --      Pain Edu? --      Excl. in GC? --    No data found.  Updated Vital Signs BP (!) 168/75 (BP Location: Left Arm)   Pulse 72   Temp (!) 100.7 F (38.2 C) (Oral)   Resp 18   Ht 5\' 3"  (1.6 m)    Wt 214 lb 15.2 oz (97.5 kg)   SpO2 100%   BMI 38.08 kg/m   Visual Acuity Right Eye Distance:   Left Eye Distance:   Bilateral Distance:    Right Eye Near:   Left Eye Near:    Bilateral Near:     Physical Exam Vitals and nursing note reviewed.  Constitutional:  General: She is not in acute distress.    Appearance: Normal appearance. She is not ill-appearing.  HENT:     Head: Normocephalic and atraumatic.     Right Ear: Tympanic membrane, ear canal and external ear normal. There is no impacted cerumen.     Left Ear: Tympanic membrane, ear canal and external ear normal. There is no impacted cerumen.     Nose: Congestion present. No rhinorrhea.     Mouth/Throat:     Mouth: Mucous membranes are moist.     Pharynx: Oropharynx is clear. No oropharyngeal exudate or posterior oropharyngeal erythema.  Cardiovascular:     Rate and Rhythm: Normal rate and regular rhythm.     Pulses: Normal pulses.     Heart sounds: Normal heart sounds. No murmur heard.   No gallop.  Pulmonary:     Effort: Pulmonary effort is normal.     Breath sounds: Normal breath sounds. No wheezing, rhonchi or rales.  Musculoskeletal:     Cervical back: Normal range of motion and neck supple.  Lymphadenopathy:     Cervical: Cervical adenopathy present.  Skin:    General: Skin is warm and dry.     Capillary Refill: Capillary refill takes less than 2 seconds.     Findings: No erythema or rash.  Neurological:     General: No focal deficit present.     Mental Status: She is alert and oriented to person, place, and time.  Psychiatric:        Mood and Affect: Mood normal.        Behavior: Behavior normal.        Thought Content: Thought content normal.        Judgment: Judgment normal.     UC Treatments / Results  Labs (all labs ordered are listed, but only abnormal results are displayed) Labs Reviewed  RESP PANEL BY RT-PCR (FLU A&B, COVID) [id number]    EKG   Radiology No results  found.  Procedures Procedures (including critical care time)  Medications Ordered in UC Medications - No data to display  Initial Impression / Assessment and Plan / UC Course  I have reviewed the triage vital signs and the nursing notes.  Pertinent labs & imaging results that were available during my care of the patient were reviewed by me and considered in my medical decision making (see chart for details).  A pleasant, nontoxic appearing 65 year old female here for evaluation of respiratory complaints who tested positive at home with COVID test.  She is requesting to be retested as the test had expired.  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  He is unaware of any positive exposure but she says is always a possibility given her job.  Patient's physical exam reveals pearly gray tympanic membranes bilaterally with a normal light reflex and clear external auditory canals.  Nasal mucosa is mildly erythematous and edematous with scant clear nasal discharge.  Oropharyngeal exam is benign.  Patient does have bilateral anterior cervical lymphadenopathy.  Cardiopulmonary exam reveals clear lung sounds all fields.  Patient has had recent blood work and has a GFR 50.  Respiratory triplex panel was collected at triage and if positive, as I am expecting, will treat with Paxlovid, Atrovent nasal spray, Promethazine DM cough syrup, and Tessalon Perles.  Respiratory triplex panel is positive for COVID.  Will discharge patient home to quarantine for 5 days and will prescribe Paxlovid.  We will also provide prescription for Tessalon Perles, Atrovent  nasal spray, and Promethazine DM cough syrup to help with symptomology.  Work-up provided.   Final Clinical Impressions(s) / UC Diagnoses   Final diagnoses:  COVID-19     Discharge Instructions      You will have to quarantine for 5 days from the start of your symptoms.  After 5 days you can break quarantine if your symptoms  have improved and you have not had a fever for 24 hours without taking Tylenol or ibuprofen.  Use over-the-counter Tylenol and ibuprofen as needed for body aches and fever.  Use the Tessalon Perles during the day as needed for cough and the Promethazine DM cough syrup at nighttime as will make you drowsy.  Take the Paxlovid twice daily for 5 days for treatment of COVID-19.  Use the Atrovent nasal spray, 2 squirts in each nostril every 6 hours, as needed for nasal congestion.  If you develop any increased shortness of breath-especially at rest, you are unable to speak in full sentences, or is a late sign your lips are turning blue you need to go the ER for evaluation.      ED Prescriptions     Medication Sig Dispense Auth. Provider   nirmatrelvir/ritonavir EUA, renal dosing, (PAXLOVID) 10 x 150 MG & 10 x 100MG  TABS Take 2 tablets by mouth 2 (two) times daily for 5 days. Patient GFR is 50. Take nirmatrelvir (150 mg) one tablet twice daily for 5 days and ritonavir (100 mg) one tablet twice daily for 5 days. 20 tablet , NP   benzonatate (TESSALON) 100 MG capsule Take 2 capsules (200 mg total) by mouth every 8 (eight) hours. 21 capsule Becky Augusta, NP   ipratropium (ATROVENT) 0.06 % nasal spray Place 2 sprays into both nostrils 4 (four) times daily. 15 mL Becky Augusta, NP   promethazine-dextromethorphan (PROMETHAZINE-DM) 6.25-15 MG/5ML syrup Take 5 mLs by mouth 4 (four) times daily as needed. 118 mL 09-28-1990, NP      PDMP not reviewed this encounter.   Becky Augusta, NP 04/14/21 [phone]

## 2021-04-14 NOTE — Discharge Instructions (Addendum)
You will have to quarantine for 5 days from the start of your symptoms.  After 5 days you can break quarantine if your symptoms have improved and you have not had a fever for 24 hours without taking Tylenol or ibuprofen.  Use over-the-counter Tylenol and ibuprofen as needed for body aches and fever.  Use the Tessalon Perles during the day as needed for cough and the Promethazine DM cough syrup at nighttime as will make you drowsy.  Take the Paxlovid twice daily for 5 days for treatment of COVID-19.  Use the Atrovent nasal spray, 2 squirts in each nostril every 6 hours, as needed for nasal congestion.  If you develop any increased shortness of breath-especially at rest, you are unable to speak in full sentences, or is a late sign your lips are turning blue you need to go the ER for evaluation.

## 2021-04-14 NOTE — ED Triage Notes (Signed)
Pt c/o cough, headache, body aches, diarrhea. Started yesterday. Denies fever. She states she took a home test and was positive but it was expired so she wants to be re tested.

## 2024-01-03 ENCOUNTER — Ambulatory Visit
Admission: EM | Admit: 2024-01-03 | Discharge: 2024-01-03 | Disposition: A | Discharge status: Home / Self Care | Attending: Family Medicine | Admitting: Family Medicine

## 2024-01-03 DIAGNOSIS — H6121 Impacted cerumen, right ear: Secondary | ICD-10-CM

## 2024-01-03 NOTE — ED Triage Notes (Signed)
 Patient presents to [facility] for intermittent right ear pain, drainage x 14 days. Taking extra strength tylenol .

## 2024-01-03 NOTE — ED Provider Notes (Signed)
 [facility]    CSN: [id number] Arrival date & time: 01/03/24  1335      History   Chief Complaint Chief Complaint  Patient presents with   Ear Drainage    HPI Shari Leon is a 68 y.o. female.   HPI   Shari Leon presents for intermittent right ear pain for the past 2 weeks. Having decreased hearing on the right side. Sounds like fluid in her ear. Has dull shooting and sharp pain. Endorses intermittent dizziness. Tried OTC removal without relief.  No fever, cough, rhinorrhea and nasal congestion. Has slight right headache.     Past Medical History:  Diagnosis Date   Arthritis    Depression    Diabetes mellitus without complication (HCC)    GERD (gastroesophageal reflux disease) 2014   history of h. pylori   Hepatitis    Hypertension    Peripheral neuropathic pain 2016   both feet    Patient Active Problem List   Diagnosis Date Noted   Bariatric surgery status 04/09/2015    Past Surgical History:  Procedure Laterality Date   CESAREAN SECTION N/A    x 3 1982, 1983 and 1995.   GASTRIC ROUX-EN-Y N/A 04/09/2015   Procedure: LAPAROSCOPIC ROUX-EN-Y GASTRIC;  Surgeon: Shari DELENA Mano, MD;  Location: [facility];  Service: General;  Laterality: N/A;   HIATAL HERNIA REPAIR  04/09/2015   Procedure: HERNIA REPAIR HIATAL;  Surgeon: Shari DELENA Mano, MD;  Location: [facility];  Service: General;;   LYSIS OF ADHESION  04/09/2015   Procedure: LYSIS OF ADHESION;  Surgeon: Shari DELENA Mano, MD;  Location: [facility];  Service: General;;   UMBILICAL HERNIA REPAIR  04/09/2015   Procedure: HERNIA REPAIR UMBILICAL ADULT;  Surgeon: Shari DELENA Mano, MD;  Location: [facility];  Service: General;;    OB History   No obstetric history on file.      Home Medications    Prior to Admission medications   Medication Sig Start Date End Date Taking? Authorizing Provider  acetaminophen  (TYLENOL ) 500 MG tablet Take 500 mg by mouth 2 (two) times daily as needed.    [provider]   aspirin EC 81 MG tablet Take 81 mg by mouth daily.    [provider]  benzonatate  (TESSALON ) 100 MG capsule Take 2 capsules (200 mg total) by mouth every 8 (eight) hours. 04/14/21   Bernardino Ditch, NP  cloNIDine  (CATAPRES  - DOSED IN MG/24 HR) 0.3 mg/24hr patch Place 0.3 mg onto the skin once a week. Applied on Sundays    [provider]  diclofenac sodium (VOLTAREN) 1 % GEL Apply 2 g topically 2 (two) times daily as needed.    [provider]  DULoxetine (CYMBALTA) 20 MG capsule Take 20 mg by mouth daily.    [provider]  fluticasone  (FLONASE ) 50 MCG/ACT nasal spray Place 2 sprays into both nostrils 2 (two) times daily. As needed.    [provider]  gabapentin  (NEURONTIN ) 600 MG tablet Take 1 tablet (600 mg total) by mouth at bedtime. 04/10/15   Leon Shari DELENA, MD  hydroxychloroquine (PLAQUENIL) 200 MG tablet Take 400 mg by mouth daily.    [provider]  ipratropium (ATROVENT ) 0.06 % nasal spray Place 2 sprays into both nostrils 4 (four) times daily. 04/14/21   Bernardino Ditch, NP  metoprolol  tartrate (LOPRESSOR ) 12.5 mg TABS tablet Take 12.5 mg by mouth 2 (two) times daily.    [provider]  Multiple Vitamin (MULTIVITAMIN) tablet Take  1 tablet by mouth daily.    [provider]  promethazine -dextromethorphan (PROMETHAZINE -DM) 6.25-15 MG/5ML syrup Take 5 mLs by mouth 4 (four) times daily as needed. 04/14/21   Bernardino Ditch, NP  valsartan-hydrochlorothiazide (DIOVAN-HCT) 160-12.5 MG tablet Take 1 tablet by mouth daily.    [provider]    Family History Family History  Problem Relation Age of Onset   Diabetes Unknown    Hypertension Mother    Healthy Father     Social History Social History   Tobacco Use   Smoking status: Never   Smokeless tobacco: Never  Vaping Use   Vaping status: Never Used  Substance Use Topics   Alcohol use: No   Drug use: No     Allergies   Fish-derived products, Humira  [adalimumab], and Lisinopril   Review of Systems Review of Systems: :negative unless otherwise stated in HPI.      Physical Exam Triage Vital Signs ED Triage Vitals  Encounter Vitals Group     BP 01/03/24 1349 (!) 155/73     Systolic BP Percentile --      Diastolic BP Percentile --      Pulse Rate 01/03/24 1349 60     Resp 01/03/24 1349 16     Temp 01/03/24 1349 98.4 F (36.9 C)     Temp Source 01/03/24 1349 Oral     SpO2 01/03/24 1349 96 %     Weight --      Height --      Head Circumference --      Peak Flow --      Pain Score 01/03/24 1348 4     Pain Loc --      Pain Education --      Exclude from Growth Chart --    No data found.  Updated Vital Signs BP (!) 155/73 (BP Location: Left Arm)   Pulse 60   Temp 98.4 F (36.9 C) (Oral)   Resp 16   SpO2 96%   Visual Acuity Right Eye Distance:   Left Eye Distance:   Bilateral Distance:    Right Eye Near:   Left Eye Near:    Bilateral Near:     Physical Exam GEN:     alert, non-toxic appearing female in no distress    HENT:  mucus membranes moist, no nasal discharge, right TM not visible due to cerumen impaction, left TM normal, normal external auditory canals bilaterally, nontender tragus EYES:   no scleral injection RESP:  no increased work of breathing Skin:   warm and dry    UC Treatments / Results  Labs (all labs ordered are listed, but only abnormal results are displayed) Labs Reviewed - No data to display  EKG   Radiology No results found.  Procedures Procedures (including critical care time) Procedures Ear Cerumen Removal   Performed by: nursing staff Authorized by: Caprice Porteous, DO   Consent:    Consent obtained:  Verbal   Consent given by:  Patient   Risks discussed:  Bleeding, dizziness, infection, incomplete removal, TM perforation and pain   Alternatives discussed:  No treatment Procedure details:    Location:  Right ear   Procedure type: irrigation   Post-procedure  details:    Inspection:  TM intact    Hearing quality:  Improved   Patient tolerance of procedure:  Tolerated well, no immediate complications   Medications Ordered in UC Medications - No data to display  Initial Impression / Assessment and Plan /  UC Course  I have reviewed the triage vital signs and the nursing notes.  Pertinent labs & imaging results that were available during my care of the patient were reviewed by me and considered in my medical decision making (see chart for details).     Pt is a 68 y.o. female with 14 days of hearing  loss with right ear pain. Ceruminosis is noted on the right.  Wax is removed by syringing debridement.  On reassessment, right TM clear and without erythema or bulging. No purulent discharge in canal. Doubt acute otitis media or externa. Hearing has improved. Instructions for home care to prevent wax buildup are given.  Discussed MDM, treatment plan and plan for follow-up with patient who agrees with plan.    Final Clinical Impressions(s) / UC Diagnoses   Final diagnoses:  Hearing loss due to cerumen impaction, right     Discharge Instructions      Stop by the pharmacy to pick up Debrox for earwax removal or use hydrogen peroxide at home.        ED Prescriptions   None    PDMP not reviewed this encounter.   Kriste Berth, DO 01/15/24 2257

## 2024-01-03 NOTE — Discharge Instructions (Addendum)
 Stop by the pharmacy to pick up Debrox for earwax removal or use hydrogen peroxide at home.
